# Patient Record
Sex: Female | Born: 1960
Health system: Southern US, Community
[De-identification: ages and names within clinical notes are randomized; demographics above are authoritative.]

## PROBLEM LIST (undated history)

## (undated) DIAGNOSIS — I10 Essential (primary) hypertension: Secondary | ICD-10-CM

---

## 1999-12-18 ENCOUNTER — Other Ambulatory Visit: Admission: RE | Admit: 1999-12-18 | Discharge: 1999-12-18 | Payer: Self-pay | Admitting: Obstetrics and Gynecology

## 2000-06-01 ENCOUNTER — Encounter: Payer: Self-pay | Admitting: Obstetrics & Gynecology

## 2000-06-01 ENCOUNTER — Encounter: Admission: RE | Admit: 2000-06-01 | Discharge: 2000-06-01 | Payer: Self-pay | Admitting: Obstetrics & Gynecology

## 2001-01-26 ENCOUNTER — Other Ambulatory Visit: Admission: RE | Admit: 2001-01-26 | Discharge: 2001-01-26 | Payer: Self-pay | Admitting: Obstetrics and Gynecology

## 2009-06-05 ENCOUNTER — Emergency Department (HOSPITAL_COMMUNITY): Admission: EM | Admit: 2009-06-05 | Discharge: 2009-06-05 | Payer: Self-pay | Admitting: Emergency Medicine

## 2010-03-05 ENCOUNTER — Encounter: Admission: RE | Admit: 2010-03-05 | Discharge: 2010-03-05 | Payer: Self-pay | Admitting: Internal Medicine

## 2011-02-09 LAB — CBC
HCT: 39.2 % (ref 36.0–46.0)
Hemoglobin: 13.1 g/dL (ref 12.0–15.0)
MCHC: 33.5 g/dL (ref 30.0–36.0)
MCV: 88.1 fL (ref 78.0–100.0)
Platelets: 291 10*3/uL (ref 150–400)
RBC: 4.45 MIL/uL (ref 3.87–5.11)
RDW: 13.8 % (ref 11.5–15.5)
WBC: 6.2 10*3/uL (ref 4.0–10.5)

## 2011-02-09 LAB — COMPREHENSIVE METABOLIC PANEL
ALT: 32 U/L (ref 0–35)
AST: 30 U/L (ref 0–37)
Albumin: 4.1 g/dL (ref 3.5–5.2)
Alkaline Phosphatase: 52 U/L (ref 39–117)
Chloride: 100 mEq/L (ref 96–112)
GFR calc Af Amer: >60 mL/min (ref >60)
Potassium: 2.9 mEq/L — ABNORMAL LOW (ref 3.5–5.1)
Sodium: 136 mEq/L (ref 135–145)
Total Bilirubin: 0.6 mg/dL (ref 0.3–1.2)
Total Protein: 7.9 g/dL (ref 6.0–8.3)

## 2011-02-09 LAB — URINALYSIS, ROUTINE W REFLEX MICROSCOPIC
Glucose, UA: NEGATIVE mg/dL
Ketones, ur: NEGATIVE mg/dL
Leukocytes, UA: NEGATIVE
Protein, ur: NEGATIVE mg/dL
pH: 5.5 (ref 5.0–8.0)

## 2011-02-09 LAB — URINE MICROSCOPIC-ADD ON

## 2011-02-09 LAB — POCT CARDIAC MARKERS
Myoglobin, poc: 35.4 ng/mL (ref 12–200)
Myoglobin, poc: 42 ng/mL (ref 12–200)
Troponin i, poc: <0.05 ng/mL (ref 0.00–0.09)

## 2011-02-09 LAB — DIFFERENTIAL
Basophils Absolute: 0 10*3/uL (ref 0.0–0.1)
Basophils Relative: 1 % (ref 0–1)
Eosinophils Relative: 3 % (ref 0–5)
Monocytes Absolute: 0.5 10*3/uL (ref 0.1–1.0)
Monocytes Relative: 8 % (ref 3–12)
Neutro Abs: 3.3 10*3/uL (ref 1.7–7.7)

## 2011-04-08 ENCOUNTER — Other Ambulatory Visit: Payer: Self-pay | Admitting: Internal Medicine

## 2011-04-08 DIAGNOSIS — Z1231 Encounter for screening mammogram for malignant neoplasm of breast: Secondary | ICD-10-CM

## 2011-04-16 ENCOUNTER — Ambulatory Visit
Admission: RE | Admit: 2011-04-16 | Discharge: 2011-04-16 | Disposition: A | Discharge status: Home / Self Care | Payer: BC Managed Care – PPO | Source: Ambulatory Visit | Attending: Internal Medicine | Admitting: Internal Medicine

## 2011-04-16 DIAGNOSIS — Z1231 Encounter for screening mammogram for malignant neoplasm of breast: Secondary | ICD-10-CM

## 2015-06-07 ENCOUNTER — Emergency Department (HOSPITAL_COMMUNITY): Payer: BC Managed Care – PPO

## 2015-06-07 ENCOUNTER — Encounter (HOSPITAL_COMMUNITY): Payer: Self-pay | Admitting: Emergency Medicine

## 2015-06-07 ENCOUNTER — Emergency Department (HOSPITAL_COMMUNITY)
Admission: EM | Admit: 2015-06-07 | Discharge: 2015-06-07 | Disposition: A | Discharge status: Home / Self Care | Payer: BC Managed Care – PPO | Attending: Emergency Medicine | Admitting: Emergency Medicine

## 2015-06-07 DIAGNOSIS — S20312A Abrasion of left front wall of thorax, initial encounter: Secondary | ICD-10-CM | POA: Diagnosis not present

## 2015-06-07 DIAGNOSIS — Y998 Other external cause status: Secondary | ICD-10-CM | POA: Insufficient documentation

## 2015-06-07 DIAGNOSIS — Y92410 Unspecified street and highway as the place of occurrence of the external cause: Secondary | ICD-10-CM | POA: Insufficient documentation

## 2015-06-07 DIAGNOSIS — I10 Essential (primary) hypertension: Secondary | ICD-10-CM | POA: Insufficient documentation

## 2015-06-07 DIAGNOSIS — Y9389 Activity, other specified: Secondary | ICD-10-CM | POA: Insufficient documentation

## 2015-06-07 DIAGNOSIS — S50312A Abrasion of left elbow, initial encounter: Secondary | ICD-10-CM | POA: Diagnosis not present

## 2015-06-07 DIAGNOSIS — S6992XA Unspecified injury of left wrist, hand and finger(s), initial encounter: Secondary | ICD-10-CM | POA: Insufficient documentation

## 2015-06-07 DIAGNOSIS — Z720 Tobacco use: Secondary | ICD-10-CM | POA: Diagnosis not present

## 2015-06-07 DIAGNOSIS — M79642 Pain in left hand: Secondary | ICD-10-CM

## 2015-06-07 DIAGNOSIS — S81011A Laceration without foreign body, right knee, initial encounter: Secondary | ICD-10-CM | POA: Insufficient documentation

## 2015-06-07 HISTORY — DX: Essential (primary) hypertension: I10

## 2015-06-07 LAB — I-STAT CREATININE, ED: Creatinine, Ser: 1.1 mg/dL — ABNORMAL HIGH (ref 0.44–1.00)

## 2015-06-07 MED ORDER — METHOCARBAMOL 500 MG PO TABS: 500.0000 mg | ORAL_TABLET | Freq: Two times a day (BID) | ORAL | Status: DC

## 2015-06-07 MED ORDER — NAPROXEN 250 MG PO TABS
500.0000 mg | ORAL_TABLET | Freq: Once | ORAL | Status: AC
  Administered 2015-06-07: 500 mg via ORAL
  Filled 2015-06-07: qty 2

## 2015-06-07 MED ORDER — MORPHINE SULFATE 4 MG/ML IJ SOLN
4.0000 mg | Freq: Once | INTRAMUSCULAR | Status: AC
  Administered 2015-06-07: 4 mg via INTRAVENOUS
  Filled 2015-06-07: qty 1

## 2015-06-07 MED ORDER — IOHEXOL 300 MG/ML  SOLN
75.0000 mL | Freq: Once | INTRAMUSCULAR | Status: AC | PRN
  Administered 2015-06-07: 75 mL via INTRAVENOUS

## 2015-06-07 MED ORDER — HYDROCODONE-ACETAMINOPHEN 5-325 MG PO TABS: 1.0000 | ORAL_TABLET | Freq: Four times a day (QID) | ORAL | Status: DC | PRN

## 2015-06-07 MED ORDER — HYDROCODONE-ACETAMINOPHEN 5-325 MG PO TABS
1.0000 | ORAL_TABLET | Freq: Once | ORAL | Status: AC
  Administered 2015-06-07: 1 via ORAL
  Filled 2015-06-07: qty 1

## 2015-06-07 MED ORDER — TETANUS-DIPHTH-ACELL PERTUSSIS 5-2.5-18.5 LF-MCG/0.5 IM SUSP
0.5000 mL | Freq: Once | INTRAMUSCULAR | Status: AC
Start: 2015-06-07 — End: 2015-06-07
  Administered 2015-06-07: 0.5 mL via INTRAMUSCULAR
  Filled 2015-06-07: qty 0.5

## 2015-06-07 MED ORDER — ONDANSETRON HCL 4 MG/2ML IJ SOLN
4.0000 mg | Freq: Once | INTRAMUSCULAR | Status: AC
  Administered 2015-06-07: 4 mg via INTRAVENOUS
  Filled 2015-06-07: qty 2

## 2015-06-07 MED ORDER — NAPROXEN 500 MG PO TABS: 500.0000 mg | ORAL_TABLET | Freq: Two times a day (BID) | ORAL | Status: DC
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
## 2015-06-07 NOTE — ED Notes (Signed)
ED PA informed of pt BP. Pt c/o increased pain after moving around to left wrist. Pt has hx of HTN but not on BP medications anymore because of diet and exercise. Pt informed of need to recheck BP at PCP office. Prescribed pain medications being ordered for pt to take a dose here.

## 2015-06-07 NOTE — ED Notes (Signed)
Patient transported to CT 
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
## 2015-11-09 ENCOUNTER — Ambulatory Visit: Payer: BC Managed Care – PPO | Attending: Family Medicine

## 2015-12-13 ENCOUNTER — Ambulatory Visit (INDEPENDENT_AMBULATORY_CARE_PROVIDER_SITE_OTHER): Payer: BC Managed Care – PPO | Admitting: Family Medicine

## 2015-12-13 ENCOUNTER — Encounter: Payer: Self-pay | Admitting: Family Medicine

## 2015-12-13 VITALS — BP 150/84 | HR 89 | Temp 98.3°F | Ht 66.0 in

## 2015-12-13 DIAGNOSIS — Z1239 Encounter for other screening for malignant neoplasm of breast: Secondary | ICD-10-CM | POA: Diagnosis not present

## 2015-12-13 DIAGNOSIS — Z23 Encounter for immunization: Secondary | ICD-10-CM

## 2015-12-13 DIAGNOSIS — Z1211 Encounter for screening for malignant neoplasm of colon: Secondary | ICD-10-CM

## 2015-12-13 DIAGNOSIS — I10 Essential (primary) hypertension: Secondary | ICD-10-CM

## 2015-12-13 DIAGNOSIS — Z7689 Persons encountering health services in other specified circumstances: Secondary | ICD-10-CM

## 2015-12-13 DIAGNOSIS — Z7189 Other specified counseling: Secondary | ICD-10-CM

## 2015-12-13 LAB — COMPLETE METABOLIC PANEL WITH GFR
ALBUMIN: 4.4 g/dL (ref 3.6–5.1)
ALK PHOS: 57 U/L (ref 33–130)
ALT: 56 U/L — AB (ref 6–29)
AST: 31 U/L (ref 10–35)
BUN: 11 mg/dL (ref 7–25)
CALCIUM: 9.5 mg/dL (ref 8.6–10.4)
CHLORIDE: 106 mmol/L (ref 98–110)
CO2: 25 mmol/L (ref 20–31)
CREATININE: 0.93 mg/dL (ref 0.50–1.05)
GFR, EST AFRICAN AMERICAN: 82 mL/min (ref >=60)
GFR, Est Non African American: 71 mL/min (ref >=60)
Glucose, Bld: 77 mg/dL (ref 65–99)
POTASSIUM: 4.5 mmol/L (ref 3.5–5.3)
Sodium: 141 mmol/L (ref 135–146)
Total Bilirubin: 0.7 mg/dL (ref 0.2–1.2)
Total Protein: 7.6 g/dL (ref 6.1–8.1)

## 2015-12-13 LAB — CBC WITH DIFFERENTIAL/PLATELET
BASOS ABS: 0.1 10*3/uL (ref 0.0–0.1)
BASOS PCT: 1 % (ref 0–1)
EOS PCT: 5 % (ref 0–5)
Eosinophils Absolute: 0.3 10*3/uL (ref 0.0–0.7)
HEMATOCRIT: 41.6 % (ref 36.0–46.0)
Hemoglobin: 13.5 g/dL (ref 12.0–15.0)
LYMPHS PCT: 41 % (ref 12–46)
Lymphs Abs: 2.5 10*3/uL (ref 0.7–4.0)
MCH: 28.5 pg (ref 26.0–34.0)
MCHC: 32.5 g/dL (ref 30.0–36.0)
MCV: 87.8 fL (ref 78.0–100.0)
MONO ABS: 0.5 10*3/uL (ref 0.1–1.0)
MPV: 9.4 fL (ref 8.6–12.4)
Monocytes Relative: 8 % (ref 3–12)
Neutro Abs: 2.7 10*3/uL (ref 1.7–7.7)
Neutrophils Relative %: 45 % (ref 43–77)
PLATELETS: 214 10*3/uL (ref 150–400)
RBC: 4.74 MIL/uL (ref 3.87–5.11)
RDW: 14.7 % (ref 11.5–15.5)
WBC: 6.1 10*3/uL (ref 4.0–10.5)

## 2015-12-13 LAB — TSH: TSH: 1.87 mIU/L

## 2015-12-13 LAB — LIPID PANEL
CHOLESTEROL: 153 mg/dL (ref 125–200)
HDL: 87 mg/dL (ref >=46)
LDL CALC: 54 mg/dL (ref <130)
TRIGLYCERIDES: 59 mg/dL (ref <150)
Total CHOL/HDL Ratio: 1.8 Ratio (ref <=5.0)
VLDL: 12 mg/dL (ref <30)

## 2015-12-13 MED ORDER — AMLODIPINE BESYLATE 10 MG PO TABS: 10.0000 mg | ORAL_TABLET | Freq: Every day | ORAL | Status: DC

## 2015-12-13 NOTE — Patient Instructions (Signed)
Recheck BP with nurse visit in one month.  Watch salt in diet. Call [phone] to set up mammogram Avoid lots of fats, cholesterol, carbs. Eat more fresh fruits and vegetables and lean protein that is not fried Exercise regularly.

## 2015-12-13 NOTE — Progress Notes (Signed)
Patient ID: Meagan Barrett, female   DOB: 1961-02-14, 55 y.o.   MRN: [id number]   Meagan Barrett, is a 55 y.o. female  [id number]  [id number]  DOB - August 25, 1961  CC:  Chief Complaint  Patient presents with  . Establish Care  . Dental Pain    needs a referral  . Hypertension    has noticed blood preasure is elevated  . Depression       HPI: Meagan Barrett is a 55 y.o. female here to establish care. She has a history of hypertension. She was on medication in the past but not in last few years. She recently was a dentist and was found to have elevated BP. She reports dental pain and request a dental referral with her orange card. She admits to a low level of depression, which she connect to a divorce 7 years ago. She denies needing help with counseling or medication. She reports to having Sickle Cell Trait. She is on no chronic medications.   Health Maintenance:  She declines a flu shot but agrees to a Tdap. She is in need of a mammogram. She also needs colon cancer screening.  She reports having a normal PAP in 2016 and will get me those results.   No Known Allergies Past Medical History  Diagnosis Date  . Hypertension    Current Outpatient Prescriptions on File Prior to Visit  Medication Sig Dispense Refill  . diphenhydramine-acetaminophen (TYLENOL PM) 25-500 MG TABS Take 1 tablet by mouth at bedtime as needed (headache). Reported on 12/13/2015     No current facility-administered medications on file prior to visit.   Family History  Problem Relation Age of Onset  . Family history unknown: Yes   Social History   Social History  . Marital Status: Single    Spouse Name: N/A  . Number of Children: N/A  . Years of Education: N/A   Occupational History  . Not on file.   Social History Main Topics  . Smoking status: Current Some Day Smoker  . Smokeless tobacco: Not on file  . Alcohol Use: Yes  . Drug Use: Yes    Special: Marijuana  . Sexual Activity: Not on file    Other Topics Concern  . Not on file   Social History Narrative    Review of Systems: Constitutional: Negative for fever, chills, appetite change. She reports fatigue and loss of appetite, related to "being blue" Skin: Negative for rashes or lesions of concern. HENT: Negative for ear pain, ear discharge.nose bleeds Eyes: Negative for pain, discharge, redness, itching and visual disturbance. Neck: Negative for pain, stiffness Respiratory: Negative for cough, shortness of breath,   Cardiovascular: Negative for chest pain, palpitations and leg swelling. Gastrointestinal: Negative for abdominal pain, nausea, vomiting, diarrhea, constipations Genitourinary: Negative for dysuria, urgency, frequency, hematuria,  Musculoskeletal: Negative for back pain, joint pain, joint  swelling, and gait problem.Negative for weakness. Neurological: Negative for  tremors, seizures, syncope,   light-headedness, numbness and headaches. Positive for occassional dizziness (lightheadedness).  Hematological: Negative for easy bruising or bleeding Psychiatric/Behavioral: Positive for depression, anxiety. Denies need for intervention.   Objective:   Filed Vitals:   12/13/15 0834  BP: 150/84  Pulse: 89  Temp: 98.3 F (36.8 C)    Physical Exam: Constitutional: Patient appears well-developed and well-nourished. No distress. HENT: Normocephalic, atraumatic, External right and left ear normal. Oropharynx is clear and moist.  Eyes: Conjunctivae and EOM are normal. PERRLA, no scleral icterus. Neck: Normal ROM. Neck supple. No  lymphadenopathy, No thyromegaly. CVS: RRR, S1/S2 +, no murmurs, no gallops, no rubs Pulmonary: Effort and breath sounds normal, no stridor, rhonchi, wheezes, rales.  Abdominal: Soft. Normoactive BS,, no distension, tenderness, rebound or guarding.  Musculoskeletal: Normal range of motion. No edema and no tenderness.  Neuro: Alert.Normal muscle tone coordination. Non-focal Skin: Skin is  warm and dry. No rash noted. Not diaphoretic. No erythema. No pallor. Psychiatric: Normal mood and affect. Behavior, judgment, thought content normal.  Lab Results  Component Value Date   WBC 6.2 06/05/2009   HGB 13.1 06/05/2009   HCT 39.2 06/05/2009   MCV 88.1 06/05/2009   PLT 291 06/05/2009   Lab Results  Component Value Date   CREATININE 1.10* 06/07/2015   BUN 7 06/05/2009   NA 136 06/05/2009   K 2.9* 06/05/2009   CL 100 06/05/2009   CO2 27 06/05/2009    No results found for: HGBA1C Lipid Panel  No results found for: CHOL, TRIG, HDL, CHOLHDL, VLDL, LDLCALC     Assessment and plan:   1. Essential hypertension  - COMPLETE METABOLIC PANEL WITH GFR - CBC w/Diff - Lipid panel - TSH  2. Colon cancer screening  - [place] Stool Card-TAKE HOME  3. Need for Tdap vaccination - Tdap vaccine greater than or equal to 7yo IM  4. Breast cancer screening  - MM DIGITAL SCREENING BILATERAL; Future  5. Encounter to establish care - I have reviewed information provided by the patient and pertinent information from the chart. -I have advise her on health life style practices as to diet and exercise.   Return in about 3 months (around 03/11/2016) for HTN.  The patient was given clear instructions to go to ER or return to medical center if symptoms don't improve, worsen or new problems develop. The patient verbalized understanding.    Meagan Hoover FNP  12/13/2015, 10:42 AM

## 2015-12-14 ENCOUNTER — Telehealth: Payer: Self-pay

## 2015-12-14 MED FILL — ?AMLODIPINE BESYLATE 10 MG: 10 | 30 days supply | Qty: 30 | Fill #0

## 2015-12-14 NOTE — Telephone Encounter (Signed)
Patient return call and i advised of labs and to avoid alcohol and tylenol. Thanks!

## 2015-12-14 NOTE — Telephone Encounter (Signed)
Called, no answer, left message for patient to call back regarding labs. Thanks!

## 2015-12-14 NOTE — Telephone Encounter (Signed)
-----   Message from Meagan Hoover, NP sent at 12/14/2015 10:08 AM EST ----- Cmet shows slight decrease in liver functions. Avoid alcohol and tylenol.Cholesterol is OK. TSH and CBC wnl.

## 2015-12-25 ENCOUNTER — Telehealth: Payer: Self-pay | Admitting: *Deleted

## 2015-12-25 NOTE — Telephone Encounter (Signed)
Linda please see message from patient below. Thanks!

## 2015-12-25 NOTE — Telephone Encounter (Signed)
Pt called and states that Linda wantBonita Quin to know which provider did her last PAP smear.  Her PAP smear was done at the [facility] done by Fonnie Mu on 5/16. [phone]) Please advise provider) . Thanks

## 2015-12-27 ENCOUNTER — Ambulatory Visit
Admission: RE | Admit: 2015-12-27 | Discharge: 2015-12-27 | Disposition: A | Discharge status: Home / Self Care | Payer: No Typology Code available for payment source | Source: Ambulatory Visit | Attending: Family Medicine | Admitting: Family Medicine

## 2015-12-27 DIAGNOSIS — Z1239 Encounter for other screening for malignant neoplasm of breast: Secondary | ICD-10-CM

## 2015-12-27 NOTE — Telephone Encounter (Signed)
Results are not in chart. Please see if we can get.

## 2015-12-31 NOTE — Telephone Encounter (Signed)
Spoke with Meagan Barrett at the [facility], the pap smears they perform are not put in EPIC. They only have hard copies and she could not locate this patients. I contacted patient and she states she received a letter from [facility] stating that it was WNL.

## 2016-01-10 ENCOUNTER — Other Ambulatory Visit: Payer: No Typology Code available for payment source

## 2016-01-14 ENCOUNTER — Other Ambulatory Visit: Payer: Self-pay | Admitting: Family Medicine

## 2016-01-14 ENCOUNTER — Telehealth: Payer: Self-pay | Admitting: *Deleted

## 2016-01-14 DIAGNOSIS — K029 Dental caries, unspecified: Secondary | ICD-10-CM

## 2016-01-14 NOTE — Telephone Encounter (Signed)
Pt called and stated that she needs a referral for a dentist. Pt was advised to call back in a week if she hasn't heard anything by then

## 2016-01-14 NOTE — Telephone Encounter (Signed)
Bonita QuinLinda, Can you put in referral for dentist if advised? Please advise.  Thanks!

## 2016-01-17 MED FILL — AMLODIPINE BESYLATE 10 MG T: 10 | 30 days supply | Qty: 30 | Fill #1

## 2016-01-18 MED FILL — AMOXICILLIN 500 MG CAPSULE: 500 | 7 days supply | Qty: 21 | Fill #0

## 2016-02-27 MED FILL — AMLODIPINE BESYLATE 10 MG T: 10 | 30 days supply | Qty: 30 | Fill #2

## 2016-03-13 ENCOUNTER — Ambulatory Visit: Payer: BC Managed Care – PPO | Admitting: Family Medicine

## 2016-03-14 ENCOUNTER — Encounter: Payer: Self-pay | Admitting: Family Medicine

## 2016-03-14 ENCOUNTER — Ambulatory Visit (INDEPENDENT_AMBULATORY_CARE_PROVIDER_SITE_OTHER): Payer: No Typology Code available for payment source | Admitting: Family Medicine

## 2016-03-14 VITALS — BP 118/69 | HR 71 | Temp 98.4°F | Resp 18 | Ht 65.0 in | Wt 156.0 lb

## 2016-03-14 DIAGNOSIS — N951 Menopausal and female climacteric states: Secondary | ICD-10-CM

## 2016-03-14 DIAGNOSIS — I10 Essential (primary) hypertension: Secondary | ICD-10-CM | POA: Insufficient documentation

## 2016-03-14 DIAGNOSIS — Z1211 Encounter for screening for malignant neoplasm of colon: Secondary | ICD-10-CM

## 2016-03-14 MED ORDER — AMLODIPINE BESYLATE 10 MG PO TABS: 10.0000 mg | ORAL_TABLET | Freq: Every day | ORAL | Status: DC

## 2016-03-14 MED ORDER — CONJ ESTROG-MEDROXYPROGEST ACE 0.3-1.5 MG PO TABS: 1.0000 | ORAL_TABLET | Freq: Every day | ORAL | Status: DC

## 2016-03-14 NOTE — Progress Notes (Signed)
Patient ID: Meagan Barrett, female   DOB: 06-17-1961, 55 y.o.   MRN: [id number]   Meagan Barrett, is a 55 y.o. female  [id number]  [id number]  DOB - June 28, 1961  CC:  Chief Complaint  Patient presents with  . Follow-up    3 month       HPI: Meagan Barrett is a 55 y.o. female here for follow-up of hypertension. She is on Amlodipine 10 mg daily. Her only complaints today are of fatigue and severe hot flashes causing insomnia. She wishes to try HRT. She has not tried in past.  Health Maintenance:  Needs HIV and Hep C. Will do on next visit, when we draw labs again. She has had a PAP within the last 2 years. Her mammogram is UTD. She is in need of colon cancer screening.    No Known Allergies Past Medical History  Diagnosis Date  . Hypertension    Current Outpatient Prescriptions on File Prior to Visit  Medication Sig Dispense Refill  . diphenhydramine-acetaminophen (TYLENOL PM) 25-500 MG TABS Take 1 tablet by mouth at bedtime as needed (headache). Reported on 03/14/2016    . ibuprofen (ADVIL,MOTRIN) 200 MG tablet Take 200 mg by mouth every 6 (six) hours as needed. Reported on 03/14/2016     No current facility-administered medications on file prior to visit.   Family History  Problem Relation Age of Onset  . Family history unknown: Yes   Social History   Social History  . Marital Status: Single    Spouse Name: N/A  . Number of Children: N/A  . Years of Education: N/A   Occupational History  . Not on file.   Social History Main Topics  . Smoking status: Current Some Day Smoker -- 0.25 packs/day    Types: Cigarettes  . Smokeless tobacco: Not on file  . Alcohol Use: Yes  . Drug Use: Yes    Special: Marijuana  . Sexual Activity: Not on file   Other Topics Concern  . Not on file   Social History Narrative    Review of Systems: Constitutional: Negative for fever, chills, appetite change, weight loss. Positive for fatigue, hot flashes and insomnia related to  menopause. Skin: Negative for rashes or lesions of concern. HENT: Negative for ear pain, ear discharge.nose bleeds Eyes: Negative for pain, discharge, redness, itching and visual disturbance. Neck: Negative for pain, stiffness Respiratory: Negative for cough, shortness of breath,   Cardiovascular: Negative for chest pain, palpitations and leg swelling. Gastrointestinal: Negative for abdominal pain, nausea, vomiting, diarrhea, constipations Genitourinary: Negative for dysuria, urgency, frequency, hematuria,  Musculoskeletal: Negative for back pain, joint pain, joint  swelling, and gait problem.Negative for weakness. Neurological: Negative for dizziness, tremors, seizures, syncope,   light-headedness, numbness and headaches.  Hematological: Negative for easy bruising or bleeding Psychiatric/Behavioral: Negative for depression, anxiety, decreased concentration, confusion   Objective:   Filed Vitals:   03/14/16 0836  BP: 118/69  Pulse: 71  Temp: 98.4 F (36.9 C)  Resp: 18    Physical Exam: Constitutional: Patient appears well-developed and well-nourished. No distress. HENT: Normocephalic, atraumatic, External right and left ear normal. Oropharynx is clear and moist.  Eyes: Conjunctivae and EOM are normal. PERRLA, no scleral icterus. Neck: Normal ROM. Neck supple. No lymphadenopathy, No thyromegaly. CVS: RRR, S1/S2 +, no murmurs, no gallops, no rubs Pulmonary: Effort and breath sounds normal, no stridor, rhonchi, wheezes, rales.  Abdominal: Soft. Normoactive BS,, no distension, tenderness, rebound or guarding.  Musculoskeletal: Normal range of motion. No edema  and no tenderness.  Neuro: Alert.Normal muscle tone coordination. Non-focal Skin: Skin is warm and dry. No rash noted. Not diaphoretic. No erythema. No pallor. Psychiatric: Normal mood and affect. Behavior, judgment, thought content normal.  Lab Results  Component Value Date   WBC 6.1 12/13/2015   HGB 13.5 12/13/2015   HCT  41.6 12/13/2015   MCV 87.8 12/13/2015   PLT 214 12/13/2015   Lab Results  Component Value Date   CREATININE 0.93 12/13/2015   BUN 11 12/13/2015   NA 141 12/13/2015   K 4.5 12/13/2015   CL 106 12/13/2015   CO2 25 12/13/2015    No results found for: HGBA1C Lipid Panel     Component Value Date/Time   CHOL 153 12/13/2015 0926   TRIG 59 12/13/2015 0926   HDL 87 12/13/2015 0926   CHOLHDL 1.8 12/13/2015 0926   VLDL 12 12/13/2015 0926   LDLCALC 54 12/13/2015 0926       Assessment and plan:   1. Colon cancer screening  - POC Hemoccult Bld/Stl (3-Cd Home Screen); Future   2. Menopausal Hot flashes -Prempro .3 #90, one po q day. 1 RF.   6 months.  The patient was given clear instructions to go to ER or return to medical center if symptoms don't improve, worsen or new problems develop. The patient verbalized understanding.    Henrietta HooverLinda C Bernhardt FNP  03/14/2016, 9:34 AM

## 2016-03-14 NOTE — Patient Instructions (Signed)
I will send medication for menopausal symptoms to your pharmacy Come back in 3 months for BP check and labs.Remind me that we want to do HIV and Hep C when you come back. Pay attention to what you may have eaten in previous 6 hours next time lip swellig occurs

## 2016-03-14 NOTE — Progress Notes (Signed)
Patient is here for 3 Month FU  Patient denies pain at this time.  Patient has not taken any medication and patient has only had coffee.

## 2016-03-18 MED FILL — ESTRADIOL-NORETH 0.5-0.1 MG: 0.5-0.1 | 28 days supply | Qty: 28 | Fill #0

## 2016-04-01 MED FILL — ?AMLODIPINE BESYLATE 10 MG: 10 | 30 days supply | Qty: 30 | Fill #3

## 2016-04-02 ENCOUNTER — Telehealth: Payer: Self-pay

## 2016-04-02 NOTE — Telephone Encounter (Signed)
Linda please advise. Thanks!  

## 2016-04-02 NOTE — Telephone Encounter (Signed)
Pt called and stated that her hot flashes are not any better. She is requesting a different prescription. Thanks!

## 2016-04-04 NOTE — Telephone Encounter (Signed)
While these medications have different brand names, they are all the same medication. There is no other prescription.

## 2016-04-14 MED FILL — ESTRADIOL-NORETH 0.5-0.1 MG: 0.5-0.1 | 28 days supply | Qty: 28 | Fill #1

## 2016-05-07 MED FILL — ?AMLODIPINE BESYLATE 10 MG: 10 | 30 days supply | Qty: 30 | Fill #4

## 2016-05-15 MED FILL — ESTRADIOL-NORETH 0.5-0.1 MG: 0.5-0.1 | 28 days supply | Qty: 28 | Fill #2

## 2016-06-05 MED FILL — AMLODIPINE BESYLATE 10 MG T: 10 | 30 days supply | Qty: 30 | Fill #5

## 2016-06-09 MED FILL — ESTRADIOL-NORETH 0.5-0.1 MG: 0.5-0.1 | 28 days supply | Qty: 28 | Fill #3

## 2016-06-20 ENCOUNTER — Ambulatory Visit: Payer: No Typology Code available for payment source | Admitting: Family Medicine

## 2016-07-14 MED FILL — ESTRADIOL-NORETH 0.5-0.1 MG: 0.5-0.1 | 28 days supply | Qty: 28 | Fill #4

## 2016-07-15 MED FILL — AMLODIPINE BESYLATE 10 MG T: 10 | 30 days supply | Qty: 30 | Fill #0

## 2016-07-23 ENCOUNTER — Ambulatory Visit: Payer: No Typology Code available for payment source | Admitting: Family Medicine

## 2016-08-08 MED FILL — AMLODIPINE BESYLATE 10 MG T: 10 | 30 days supply | Qty: 30 | Fill #1

## 2016-08-08 MED FILL — ESTRADIOL-NORETH 0.5-0.1 MG: 0.5-0.1 | 28 days supply | Qty: 28 | Fill #5

## 2016-09-08 ENCOUNTER — Other Ambulatory Visit: Payer: Self-pay | Admitting: Family Medicine

## 2016-09-08 MED FILL — ESTRADIOL-NORETH 0.5-0.1 MG: 0.5-0.1 | 84 days supply | Qty: 84 | Fill #0

## 2016-09-08 MED FILL — AMLODIPINE BESYLATE 10 MG T: 10 | 30 days supply | Qty: 30 | Fill #2

## 2016-09-08 NOTE — Telephone Encounter (Signed)
May refill 

## 2016-09-08 NOTE — Telephone Encounter (Signed)
Bonita QuinLinda Please advise if this can be filled. Thanks!

## 2016-09-09 NOTE — Telephone Encounter (Signed)
May refill 

## 2016-10-09 MED FILL — AMLODIPINE BESYLATE 10 MG T: 10 | 30 days supply | Qty: 30 | Fill #3

## 2016-11-11 MED FILL — AMLODIPINE BESYLATE 10 MG T: 10 | 30 days supply | Qty: 30 | Fill #4

## 2016-12-01 MED FILL — ESTRADIOL-NORETH 0.5-0.1 MG: 0.5-0.1 | 28 days supply | Qty: 28 | Fill #1

## 2016-12-10 MED FILL — AMLODIPINE BESYLATE 10 MG T: 10 | 30 days supply | Qty: 30 | Fill #5

## 2016-12-31 MED FILL — ESTRADIOL-NORETH 0.5-0.1 MG: 0.5-0.1 | 28 days supply | Qty: 28 | Fill #2

## 2017-01-12 ENCOUNTER — Other Ambulatory Visit: Payer: Self-pay | Admitting: Family Medicine

## 2017-01-14 ENCOUNTER — Other Ambulatory Visit: Payer: Self-pay | Admitting: Family Medicine

## 2017-01-19 ENCOUNTER — Other Ambulatory Visit: Payer: Self-pay | Admitting: Family Medicine

## 2017-01-19 MED FILL — AMLODIPINE BESYLATE 10 MG T: 10 | 30 days supply | Qty: 30 | Fill #0

## 2017-01-22 ENCOUNTER — Ambulatory Visit (INDEPENDENT_AMBULATORY_CARE_PROVIDER_SITE_OTHER): Payer: Self-pay | Admitting: Family Medicine

## 2017-01-22 ENCOUNTER — Encounter: Payer: Self-pay | Admitting: Family Medicine

## 2017-01-22 VITALS — BP 132/78 | HR 65 | Temp 98.7°F | Ht 65.0 in | Wt 164.0 lb

## 2017-01-22 DIAGNOSIS — N951 Menopausal and female climacteric states: Secondary | ICD-10-CM

## 2017-01-22 DIAGNOSIS — I10 Essential (primary) hypertension: Secondary | ICD-10-CM

## 2017-01-22 MED ORDER — ESTRADIOL-NORETHINDRONE ACET 0.5-0.1 MG PO TABS: 1.0000 | ORAL_TABLET | Freq: Every day | ORAL | 1 refills | Status: DC

## 2017-01-22 MED ORDER — AMLODIPINE BESYLATE 10 MG PO TABS: 10.0000 mg | ORAL_TABLET | Freq: Every day | ORAL | 2 refills | Status: DC

## 2017-01-22 MED FILL — ESTRADIOL-NORETH 0.5-0.1 MG: 0.5-0.1 | 28 days supply | Qty: 28 | Fill #0

## 2017-01-22 NOTE — Patient Instructions (Signed)
Begin taking an  81 mg Aspirin daily for heart health. Return in 6 weeks for a gynecological exam.   Venlafaxine extended-release capsules (hot flash medication) What is this medicine? VENLAFAXINE(VEN la fax een) is used to treat depression, anxiety and panic disorder. This medicine may be used for other purposes; ask your health care provider or pharmacist if you have questions. COMMON BRAND NAME(S): Effexor XR What should I tell my health care provider before I take this medicine? They need to know if you have any of these conditions: -bleeding disorders -glaucoma -heart disease -high blood pressure -high cholesterol -kidney disease -liver disease -low levels of sodium in the blood -mania or bipolar disorder -seizures -suicidal thoughts, plans, or attempt; a previous suicide attempt by you or a family -take medicines that treat or prevent blood clots -thyroid disease -an unusual or allergic reaction to venlafaxine, desvenlafaxine, other medicines, foods, dyes, or preservatives -pregnant or trying to get pregnant -breast-feeding How should I use this medicine? Take this medicine by mouth with a full glass of water. Follow the directions on the prescription label. Do not cut, crush, or chew this medicine. Take it with food. If needed, the capsule may be carefully opened and the entire contents sprinkled on a spoonful of cool applesauce. Swallow the applesauce/pellet mixture right away without chewing and follow with a glass of water to ensure complete swallowing of the pellets. Try to take your medicine at about the same time each day. Do not take your medicine more often than directed. Do not stop taking this medicine suddenly except upon the advice of your doctor. Stopping this medicine too quickly may cause serious side effects or your condition may worsen. A special MedGuide will be given to you by the pharmacist with each prescription and refill. Be sure to read this information  carefully each time. Talk to your pediatrician regarding the use of this medicine in children. Special care may be needed. Overdosage: If you think you have taken too much of this medicine contact a poison control center or emergency room at once. NOTE: This medicine is only for you. Do not share this medicine with others. What if I miss a dose? If you miss a dose, take it as soon as you can. If it is almost time for your next dose, take only that dose. Do not take double or extra doses. What may interact with this medicine? Do not take this medicine with any of the following medications: -certain medicines for fungal infections like fluconazole, itraconazole, ketoconazole, posaconazole, voriconazole -cisapride -desvenlafaxine -dofetilide -dronedarone -duloxetine -levomilnacipran -linezolid -MAOIs like Carbex, Eldepryl, Marplan, Nardil, and Parnate -methylene blue (injected into a vein) -milnacipran -pimozide -thioridazine -ziprasidone This medicine may also interact with the following medications: -amphetamines -aspirin and aspirin-like medicines -certain medicines for depression, anxiety, or psychotic disturbances -certain medicines for migraine headaches like almotriptan, eletriptan, frovatriptan, naratriptan, rizatriptan, sumatriptan, zolmitriptan -certain medicines for sleep -certain medicines that treat or prevent blood clots like dalteparin, enoxaparin, warfarin -cimetidine -clozapine -diuretics -fentanyl -furazolidone -indinavir -isoniazid -lithium -metoprolol -NSAIDS, medicines for pain and inflammation, like ibuprofen or naproxen -other medicines that prolong the QT interval (cause an abnormal heart rhythm) -procarbazine -rasagiline -supplements like St. John's wort, kava kava, valerian -tramadol -tryptophan This list may not describe all possible interactions. Give your health care provider a list of all the medicines, herbs, non-prescription drugs, or dietary  supplements you use. Also tell them if you smoke, drink alcohol, or use illegal drugs. Some items may interact with your  medicine. What should I watch for while using this medicine? Tell your doctor if your symptoms do not get better or if they get worse. Visit your doctor or health care professional for regular checks on your progress. Because it may take several weeks to see the full effects of this medicine, it is important to continue your treatment as prescribed by your doctor. Patients and their families should watch out for new or worsening thoughts of suicide or depression. Also watch out for sudden changes in feelings such as feeling anxious, agitated, panicky, irritable, hostile, aggressive, impulsive, severely restless, overly excited and hyperactive, or not being able to sleep. If this happens, especially at the beginning of treatment or after a change in dose, call your health care professional. This medicine can cause an increase in blood pressure. Check with your doctor for instructions on monitoring your blood pressure while taking this medicine. You may get drowsy or dizzy. Do not drive, use machinery, or do anything that needs mental alertness until you know how this medicine affects you. Do not stand or sit up quickly, especially if you are an older patient. This reduces the risk of dizzy or fainting spells. Alcohol may interfere with the effect of this medicine. Avoid alcoholic drinks. Your mouth may get dry. Chewing sugarless gum, sucking hard candy and drinking plenty of water will help. Contact your doctor if the problem does not go away or is severe. What side effects may I notice from receiving this medicine? Side effects that you should report to your doctor or health care professional as soon as possible: -allergic reactions like skin rash, itching or hives, swelling of the face, lips, or tongue -anxious -breathing problems -confusion -changes in vision -chest  pain -confusion -elevated mood, decreased need for sleep, racing thoughts, impulsive behavior -eye pain -fast, irregular heartbeat -feeling faint or lightheaded, falls -feeling agitated, angry, or irritable -hallucination, loss of contact with reality -high blood pressure -loss of balance or coordination -palpitations -redness, blistering, peeling or loosening of the skin, including inside the mouth -restlessness, pacing, inability to keep still -seizures -stiff muscles -suicidal thoughts or other mood changes -trouble passing urine or change in the amount of urine -trouble sleeping -unusual bleeding or bruising -unusually weak or tired -vomiting Side effects that usually do not require medical attention (report to your doctor or health care professional if they continue or are bothersome): -change in sex drive or performance -change in appetite or weight -constipation -dizziness -dry mouth -headache -increased sweating -nausea -tired This list may not describe all possible side effects. Call your doctor for medical advice about side effects. You may report side effects to FDA at 1-800-FDA-1088. Where should I keep my medicine? Keep out of the reach of children. Store at a controlled temperature between 20 and 25 degrees C (68 degrees and 77 degrees F), in a dry place. Throw away any unused medicine after the expiration date. NOTE: This sheet is a summary. It may not cover all possible information. If you have questions about this medicine, talk to your doctor, pharmacist, or health care provider.  2018 Elsevier/Gold Standard (2016-03-20 18:38:02) Menopause Menopause is the normal time of life when menstrual periods stop completely. Menopause is complete when you have missed 12 consecutive menstrual periods. It usually occurs between the ages of 48 years and 55 years. Very rarely does a woman develop menopause before the age of 40 years. At menopause, your ovaries stop  producing the female hormones estrogen and progesterone. This can cause  undesirable symptoms and also affect your health. Sometimes the symptoms may occur 4-5 years before the menopause begins. There is no relationship between menopause and:  Oral contraceptives.  Number of children you had.  Race.  The age your menstrual periods started (menarche). Heavy smokers and very thin women may develop menopause earlier in life. What are the causes?  The ovaries stop producing the female hormones estrogen and progesterone. Other causes include:  Surgery to remove both ovaries.  The ovaries stop functioning for no known reason.  Tumors of the pituitary gland in the brain.  Medical disease that affects the ovaries and hormone production.  Radiation treatment to the abdomen or pelvis.  Chemotherapy that affects the ovaries. What are the signs or symptoms?  Hot flashes.  Night sweats.  Decrease in sex drive.  Vaginal dryness and thinning of the vagina causing painful intercourse.  Dryness of the skin and developing wrinkles.  Headaches.  Tiredness.  Irritability.  Memory problems.  Weight gain.  Bladder infections.  Hair growth of the face and chest.  Infertility. More serious symptoms include:  Loss of bone (osteoporosis) causing breaks (fractures).  Depression.  Hardening and narrowing of the arteries (atherosclerosis) causing heart attacks and strokes. How is this diagnosed?  When the menstrual periods have stopped for 12 straight months.  Physical exam.  Hormone studies of the blood. How is this treated? There are many treatment choices and nearly as many questions about them. The decisions to treat or not to treat menopausal changes is an individual choice made with your health care provider. Your health care provider can discuss the treatments with you. Together, you can decide which treatment will work best for you. Your treatment choices may  include:  Hormone therapy (estrogen and progesterone).  Non-hormonal medicines.  Treating the individual symptoms with medicine (for example antidepressants for depression).  Herbal medicines that may help specific symptoms.  Counseling by a psychiatrist or psychologist.  Group therapy.  Lifestyle changes including:  Eating healthy.  Regular exercise.  Limiting caffeine and alcohol.  Stress management and meditation.  No treatment. Follow these instructions at home:  Take the medicine your health care provider gives you as directed.  Get plenty of sleep and rest.  Exercise regularly.  Eat a diet that contains calcium (good for the bones) and soy products (acts like estrogen hormone).  Avoid alcoholic beverages.  Do not smoke.  If you have hot flashes, dress in layers.  Take supplements, calcium, and vitamin D to strengthen bones.  You can use over-the-counter lubricants or moisturizers for vaginal dryness.  Group therapy is sometimes very helpful.  Acupuncture may be helpful in some cases. Contact a health care provider if:  You are not sure you are in menopause.  You are having menopausal symptoms and need advice and treatment.  You are still having menstrual periods after age 89 years.  You have pain with intercourse.  Menopause is complete (no menstrual period for 12 months) and you develop vaginal bleeding.  You need a referral to a specialist (gynecologist, psychiatrist, or psychologist) for treatment. Get help right away if:  You have severe depression.  You have excessive vaginal bleeding.  You fell and think you have a broken bone.  You have pain when you urinate.  You develop leg or chest pain.  You have a fast pounding heart beat (palpitations).  You have severe headaches.  You develop vision problems.  You feel a lump in your breast.  You have abdominal  pain or severe indigestion. This information is not intended to replace  advice given to you by your health care provider. Make sure you discuss any questions you have with your health care provider. Document Released: 01/10/2004 Document Revised: 03/27/2016 Document Reviewed: 05/19/2013 Elsevier Interactive Patient Education  2017 [employer].

## 2017-01-22 NOTE — Progress Notes (Signed)
Patient ID: Meagan Barrett, female    DOB: August 28, 1961, 56 y.o.   MRN: [id number]  PCP: Joaquin Courts, FNP  Chief Complaint  Patient presents with  . Follow-up    MEDICATION/BLOOD PRESSRE/PATIENT HAS BEEN OUT OF MEDS FOR A WEEK    Subjective:  HPI  Meagan Barrett is a 56 y.o. female presents to establish care and request medication refills. Chronic problems include: hypertension, post menopausal related hot flashes, anxiety, chronic complaint of chest pain associated with anxiety. Marland Kitchen Hot Flashes  She recently was placed on estradiol for hot flash management which she reports has completely resolved her symptoms. She has been post menopausal for less than 5 years. Episodic heat intolerance became problematic and therefore she requested treatment. Denies hx of DVT, CVA, clotting disorders. She had started smoking recently, reports complete cessation presently.  Hypertension  Blood pressure controlled during office visits with amlodipine 10 mg.  She doesn't routinely monitor blood pressure at home. Denies headaches, shortness of breath, or dizziness. Reports a greater than 5 year hx of chest pain. Had a serial of negative cardiac test and negative stress test in 2009.She was told by a cardiologist at that times chest pain was likely related to anxiety and stress. Pt endorses being a very anxious person. Pain typically last less 30 seconds. Actively exercises several times weekly which includes running. Denies ever experiencing chest pain during exercise or physical activity. Denies any lower extremity swelling.  Social History   Social History  . Marital status: Single    Spouse name: N/A  . Number of children: N/A  . Years of education: N/A   Occupational History  . Not on file.   Social History Main Topics  . Smoking status: Current Some Day Smoker    Packs/day: 0.25    Types: Cigarettes  . Smokeless tobacco: Never Used  . Alcohol use Yes  . Drug use: Yes    Types:  Marijuana  . Sexual activity: Not on file   Other Topics Concern  . Not on file   Social History Narrative  . No narrative on file    Family History  Problem Relation Age of Onset  . Family history unknown: Yes   Review of Systems See HPI Patient Active Problem List   Diagnosis Date Noted  . Essential hypertension 03/14/2016    No Known Allergies  Prior to Admission medications   Medication Sig Start Date End Date Taking? Authorizing Provider  amLODipine (NORVASC) 10 MG tablet TAKE 1 TABLET BY MOUTH DAILY. 01/19/17  Yes Henrietta Hoover, NP  Estradiol-Norethindrone Acet 0.5-0.1 MG tablet TAKE 1 TABLET BY MOUTH ONCE DAILY 09/08/16  Yes Henrietta Hoover, NP  diphenhydramine-acetaminophen (TYLENOL PM) 25-500 MG TABS Take 1 tablet by mouth at bedtime as needed (headache). Reported on 03/14/2016    Historical Provider, MD  estrogen, conjugated,-medroxyprogesterone (PREMPRO) 0.3-1.5 MG tablet Take 1 tablet by mouth daily. 03/14/16   Henrietta Hoover, NP   Past Medical, Surgical Family and Social History reviewed and updated.    Objective:   Today's Vitals   01/22/17 0849  BP: 132/78  Pulse: 65  Temp: 98.7 F (37.1 C)  TempSrc: Oral  SpO2: 100%  Weight: 164 lb (74.4 kg)  Height: 5\' 5"  (1.651 m)    Wt Readings from Last 3 Encounters:  01/22/17 164 lb (74.4 kg)  03/14/16 156 lb (70.8 kg)  06/07/15 150 lb (68 kg)    Physical Exam  Constitutional: She is oriented to  person, place, and time. She appears well-developed and well-nourished.  HENT:  Head: Normocephalic and atraumatic.  Eyes: Conjunctivae and EOM are normal. Pupils are equal, round, and reactive to light.  Neck: Normal range of motion. Neck supple.  Cardiovascular: Normal rate, regular rhythm, normal heart sounds and intact distal pulses.  Exam reveals no gallop and no friction rub.   No murmur heard. Pulmonary/Chest: Effort normal and breath sounds normal.  Neurological: She is alert and oriented to  person, place, and time.  Skin: Skin is warm and dry.  Psychiatric: She has a normal mood and affect. Her behavior is normal. Judgment and thought content normal.      Assessment & Plan:  1. Hot flashes due to menopause -Counseled and provided literature on non-hormonal recommendation for management of hot flashes. -Continue Estradiol-Norethindrone Acet 0.5-0.1 MG, take  1 tablet daily.  -Return in 6 weeks for gynecological exam and we will discuss changing to a non-hormonal medication.  2. Essential hypertension  -Continue Amlodipine 10 mg daily -Start Aspirin 81 mg once daily    Pt is very concerned regarding cost of specialty services. Due to the duration of time since her last stress test, would recommend a referral and  follow-up with cardiology. Counseled patient on worrisome signs cardiac ischemia and indication of when to seek further evaluation of the ED. This chest pain has been persisting for several years and patient is absent of chest pain today.  Return for follow-up in 6 weeks   Godfrey PickKimberly S. Tiburcio PeaHarris, MSN, [facility]NP-C [facility] [address] [place], [place] [address] [phone]
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
## 2017-02-24 MED FILL — ?AMLODIPINE BESYLATE 10 MG: 10 | 30 days supply | Qty: 30 | Fill #1

## 2017-02-24 MED FILL — ESTRADIOL-NORETH 0.5-0.1 MG: 0.5-0.1 | 28 days supply | Qty: 28 | Fill #1

## 2017-03-05 ENCOUNTER — Ambulatory Visit (INDEPENDENT_AMBULATORY_CARE_PROVIDER_SITE_OTHER): Payer: Self-pay | Admitting: Family Medicine

## 2017-03-05 ENCOUNTER — Encounter: Payer: Self-pay | Admitting: Family Medicine

## 2017-03-05 ENCOUNTER — Other Ambulatory Visit (HOSPITAL_COMMUNITY)
Admission: RE | Admit: 2017-03-05 | Discharge: 2017-03-05 | Disposition: A | Discharge status: Home / Self Care | Payer: No Typology Code available for payment source | Source: Ambulatory Visit | Attending: Family Medicine | Admitting: Family Medicine

## 2017-03-05 VITALS — BP 125/72 | HR 77 | Temp 98.1°F | Resp 16 | Ht 65.0 in | Wt 167.6 lb

## 2017-03-05 DIAGNOSIS — Z01419 Encounter for gynecological examination (general) (routine) without abnormal findings: Secondary | ICD-10-CM

## 2017-03-05 DIAGNOSIS — B9689 Other specified bacterial agents as the cause of diseases classified elsewhere: Secondary | ICD-10-CM | POA: Insufficient documentation

## 2017-03-05 DIAGNOSIS — A5901 Trichomonal vulvovaginitis: Secondary | ICD-10-CM | POA: Insufficient documentation

## 2017-03-05 DIAGNOSIS — B373 Candidiasis of vulva and vagina: Secondary | ICD-10-CM | POA: Insufficient documentation

## 2017-03-05 MED ORDER — VENLAFAXINE HCL ER 37.5 MG PO CP24: ORAL_CAPSULE | ORAL | 1 refills | Status: DC

## 2017-03-05 MED FILL — VENLAFAXINE HCL ER 37.5 MG: 37.5 | 30 days supply | Qty: 60 | Fill #0

## 2017-03-05 NOTE — Progress Notes (Signed)
Patient ID: Meagan Barrett, female    DOB: [birth year], 56 y.o.   MRN: [id number]  PCP: Joaquin Courts, FNP  Chief Complaint  Patient presents with  . Follow-up    HORMONE REPLACEMENT/ PAP    Subjective:  HPI Meagan Barrett is a 56 y.o. female presents for gynecological exam and to discuss cessation of  estradiol hormonal replacement therapy.  Medical problems include essential hypertension which is controlled. [place] has reviewed literature regarding Venlafaxine from prior office visit and is in agreement to discontinue estradiol and start medication. She has a positive family history of cancer, mother had breast and facial bone cancer. A little apprehensive as she is uncertain if medication will calm her mood and relieve her hot flashes.  HIV and Hep C vaccines due. She would like to defer these test to next visit. She is sexually active with males and declines STD screening.   Social History   Social History  . Marital status: Single    Spouse name: N/A  . Number of children: N/A  . Years of education: N/A   Occupational History  . Not on file.   Social History Main Topics  . Smoking status: Current Some Day Smoker    Packs/day: 0.25    Types: Cigarettes  . Smokeless tobacco: Never Used  . Alcohol use Yes  . Drug use: Yes    Types: Marijuana  . Sexual activity: Not on file   Other Topics Concern  . Not on file   Social History Narrative  . No narrative on file    Family History  Problem Relation Age of Onset  . Family history unknown: Yes   Review of Systems See HPI Patient Active Problem List   Diagnosis Date Noted  . Essential hypertension 03/14/2016    No Known Allergies  Prior to Admission medications   Medication Sig Start Date End Date Taking? Authorizing Provider  amLODipine (NORVASC) 10 MG tablet Take 1 tablet (10 mg total) by mouth daily. 01/22/17  Yes Bing Neighbors, FNP  Estradiol-Norethindrone Acet 0.5-0.1 MG tablet Take 1 tablet by  mouth daily. 01/22/17  Yes Bing Neighbors, FNP    Past Medical, Surgical Family and Social History reviewed and updated.    Objective:   Today's Vitals   03/05/17 0946  BP: 125/72  Pulse: 77  Resp: 16  Temp: 98.1 F (36.7 C)  TempSrc: Oral  SpO2: 100%  Weight: 167 lb 9.6 oz (76 kg)  Height: 5\' 5"  (1.651 m)    Wt Readings from Last 3 Encounters:  03/05/17 167 lb 9.6 oz (76 kg)  01/22/17 164 lb (74.4 kg)  03/14/16 156 lb (70.8 kg)   Physical Exam  Constitutional: She is oriented to person, place, and time. She appears well-developed and well-nourished.  HENT:  Head: Normocephalic and atraumatic.  Cardiovascular: Normal rate.   Pulmonary/Chest: Effort normal.  Genitourinary: Vagina normal and uterus normal. There is no rash, tenderness, lesion or injury on the right labia. There is no rash, tenderness, lesion or injury on the left labia. No erythema in the vagina. No vaginal discharge found.  Musculoskeletal: Normal range of motion.  Neurological: She is alert and oriented to person, place, and time.  Skin: Skin is warm and dry.  Psychiatric: She has a normal mood and affect. Her behavior is normal. Judgment and thought content normal.    Assessment & Plan:  1. Gynecologic exam normal - Cytology - PAP [place]  2. Hot flashes  -  Venlafaxine XR 75 mg once daily.    RTC: 6 weeks to evaluate effectiveness of therapy for hot flashes.   Godfrey PickKimberly S. Tiburcio PeaHarris, MSN, [facility]NP-C [facility] [address] , [place] [id number] [phone]

## 2017-03-05 NOTE — Patient Instructions (Signed)
Discontinue estradiol replacement.  Start Venlafaxine 37.5 mg once daily for 7 days. Increase to 75 mg (2 tablets) daily which will be your regular dose.  It is okay to start medication tomorrow. Return in 6 weeks or call me sooner if needed to evaluate effectiveness of medication.  -Will update you if any abnormalities occur from the results of your PAP.   Menopause Menopause is the normal time of life when menstrual periods stop completely. Menopause is complete when you have missed 12 consecutive menstrual periods. It usually occurs between the ages of 48 years and 55 years. Very rarely does a woman develop menopause before the age of 40 years. At menopause, your ovaries stop producing the female hormones estrogen and progesterone. This can cause undesirable symptoms and also affect your health. Sometimes the symptoms may occur 4-5 years before the menopause begins. There is no relationship between menopause and:  Oral contraceptives.  Number of children you had.  Race.  The age your menstrual periods started (menarche). Heavy smokers and very thin women may develop menopause earlier in life. What are the causes?  The ovaries stop producing the female hormones estrogen and progesterone. Other causes include:  Surgery to remove both ovaries.  The ovaries stop functioning for no known reason.  Tumors of the pituitary gland in the brain.  Medical disease that affects the ovaries and hormone production.  Radiation treatment to the abdomen or pelvis.  Chemotherapy that affects the ovaries. What are the signs or symptoms?  Hot flashes.  Night sweats.  Decrease in sex drive.  Vaginal dryness and thinning of the vagina causing painful intercourse.  Dryness of the skin and developing wrinkles.  Headaches.  Tiredness.  Irritability.  Memory problems.  Weight gain.  Bladder infections.  Hair growth of the face and chest.  Infertility. More serious symptoms  include:  Loss of bone (osteoporosis) causing breaks (fractures).  Depression.  Hardening and narrowing of the arteries (atherosclerosis) causing heart attacks and strokes. How is this diagnosed?  When the menstrual periods have stopped for 12 straight months.  Physical exam.  Hormone studies of the blood. How is this treated? There are many treatment choices and nearly as many questions about them. The decisions to treat or not to treat menopausal changes is an individual choice made with your health care provider. Your health care provider can discuss the treatments with you. Together, you can decide which treatment will work best for you. Your treatment choices may include:  Hormone therapy (estrogen and progesterone).  Non-hormonal medicines.  Treating the individual symptoms with medicine (for example antidepressants for depression).  Herbal medicines that may help specific symptoms.  Counseling by a psychiatrist or psychologist.  Group therapy.  Lifestyle changes including:  Eating healthy.  Regular exercise.  Limiting caffeine and alcohol.  Stress management and meditation.  No treatment. Follow these instructions at home:  Take the medicine your health care provider gives you as directed.  Get plenty of sleep and rest.  Exercise regularly.  Eat a diet that contains calcium (good for the bones) and soy products (acts like estrogen hormone).  Avoid alcoholic beverages.  Do not smoke.  If you have hot flashes, dress in layers.  Take supplements, calcium, and vitamin D to strengthen bones.  You can use over-the-counter lubricants or moisturizers for vaginal dryness.  Group therapy is sometimes very helpful.  Acupuncture may be helpful in some cases. Contact a health care provider if:  You are not sure you are in  menopause.  You are having menopausal symptoms and need advice and treatment.  You are still having menstrual periods after age 79  years.  You have pain with intercourse.  Menopause is complete (no menstrual period for 12 months) and you develop vaginal bleeding.  You need a referral to a specialist (gynecologist, psychiatrist, or psychologist) for treatment. Get help right away if:  You have severe depression.  You have excessive vaginal bleeding.  You fell and think you have a broken bone.  You have pain when you urinate.  You develop leg or chest pain.  You have a fast pounding heart beat (palpitations).  You have severe headaches.  You develop vision problems.  You feel a lump in your breast.  You have abdominal pain or severe indigestion. This information is not intended to replace advice given to you by your health care provider. Make sure you discuss any questions you have with your health care provider. Document Released: 01/10/2004 Document Revised: 03/27/2016 Document Reviewed: 05/19/2013 Elsevier Interactive Patient Education  2017 [employer].

## 2017-03-06 LAB — CYTOLOGY - PAP
ADEQUACY: ABSENT
Bacterial vaginitis: POSITIVE — AB
Candida vaginitis: POSITIVE — AB
Diagnosis: NEGATIVE
Trichomonas: POSITIVE — AB

## 2017-03-09 MED ORDER — METRONIDAZOLE 500 MG PO TABS: 500.0000 mg | ORAL_TABLET | Freq: Three times a day (TID) | ORAL | 0 refills | Status: DC

## 2017-03-09 MED FILL — ?METRONIDAZOLE 500 MG TABLE: 500 | 7 days supply | Qty: 21 | Fill #0

## 2017-03-09 NOTE — Addendum Note (Signed)
Addended by: Bing NeighborsHARRIS, Harlea Goetzinger S on: 03/09/2017 08:30 AM   Modules accepted: Orders

## 2017-03-13 ENCOUNTER — Telehealth: Payer: Self-pay

## 2017-03-13 NOTE — Telephone Encounter (Signed)
Left a vm for patient to callback 

## 2017-03-13 NOTE — Telephone Encounter (Signed)
Patient states that she has been on the Effexor for 1 week and the ringing of the ears started yesterday. Patient did take her dosage of medication today. Please advise

## 2017-03-13 NOTE — Telephone Encounter (Signed)
Patient will come in next week to do a swab

## 2017-03-13 NOTE — Telephone Encounter (Signed)
The ringing in her ears is less likely from the Effexor and more likely from the metronidazole she is taking for treatment of trichomonas. Ringing in the ear is a side effect from abruptly stopping Effexor not typically from taking the medication. She can discontinue the metronidazole and come in next week for a nurse visit to perform a self swab to ensure that the infection has clear. Resume Effexor discontinue Metronidazole

## 2017-03-17 ENCOUNTER — Other Ambulatory Visit: Payer: Self-pay

## 2017-03-17 ENCOUNTER — Other Ambulatory Visit (HOSPITAL_COMMUNITY)
Admission: RE | Admit: 2017-03-17 | Discharge: 2017-03-17 | Disposition: A | Discharge status: Home / Self Care | Payer: No Typology Code available for payment source | Source: Ambulatory Visit | Attending: Family Medicine | Admitting: Family Medicine

## 2017-03-17 DIAGNOSIS — N898 Other specified noninflammatory disorders of vagina: Secondary | ICD-10-CM | POA: Insufficient documentation

## 2017-03-17 NOTE — Progress Notes (Signed)
Nurse visit only

## 2017-03-18 ENCOUNTER — Telehealth: Payer: Self-pay | Admitting: Family Medicine

## 2017-03-18 LAB — CYTOLOGY, (ORAL, ANAL, URETHRAL) ANCILLARY ONLY: WET PREP (BD AFFIRM): POSITIVE — AB

## 2017-03-18 MED ORDER — FLUCONAZOLE 150 MG PO TABS: 150.0000 mg | ORAL_TABLET | Freq: Once | ORAL | 0 refills | Status: AC

## 2017-03-18 NOTE — Telephone Encounter (Signed)
Trichomonas infection has cleared. She continues to have a small amount of yeast for which I will e-prescribe one dose of Diflucan 150 mg to treat or she can purchase an OTC intravaginal cream treatment.

## 2017-03-19 MED FILL — FLUCONAZOLE 150 MG TABLET: 150 | 1 days supply | Qty: 1 | Fill #0

## 2017-03-19 NOTE — Telephone Encounter (Signed)
Left vm for patient to call back.

## 2017-03-20 NOTE — Telephone Encounter (Signed)
Patient notified

## 2017-03-26 MED FILL — ?AMLODIPINE BESYLATE 10 MG: 10 | 30 days supply | Qty: 30 | Fill #0

## 2017-04-08 ENCOUNTER — Other Ambulatory Visit: Payer: Self-pay | Admitting: Family Medicine

## 2017-04-08 MED FILL — VENLAFAXINE HCL ER 37.5 MG: 37.5 | 30 days supply | Qty: 60 | Fill #1

## 2017-04-16 ENCOUNTER — Ambulatory Visit (INDEPENDENT_AMBULATORY_CARE_PROVIDER_SITE_OTHER): Payer: Self-pay | Admitting: Family Medicine

## 2017-04-16 ENCOUNTER — Encounter: Payer: Self-pay | Admitting: Family Medicine

## 2017-04-16 VITALS — BP 124/77 | HR 68 | Temp 98.6°F | Resp 14 | Ht 65.0 in | Wt 163.2 lb

## 2017-04-16 DIAGNOSIS — R232 Flushing: Secondary | ICD-10-CM

## 2017-04-16 MED ORDER — AMLODIPINE BESYLATE 10 MG PO TABS: 10.0000 mg | ORAL_TABLET | Freq: Every day | ORAL | 2 refills | Status: DC

## 2017-04-16 MED ORDER — VENLAFAXINE HCL ER 75 MG PO CP24: ORAL_CAPSULE | ORAL | 2 refills | Status: DC

## 2017-04-16 MED FILL — VENLAFAXINE HCL ER 75 MG CA: 75 | 30 days supply | Qty: 30 | Fill #0

## 2017-04-16 MED FILL — AMLODIPINE BESYLATE 10 MG T: 10 | 30 days supply | Qty: 30 | Fill #0

## 2017-04-16 NOTE — Progress Notes (Signed)
Patient ID: Meagan Barrett, female    DOB: 01-21-1961, 55 y.o.   MRN: [id number]  PCP: Bing Neighbors, FNP  Chief Complaint  Patient presents with  . Follow-up    Therapy for hot flashes    Subjective:  HPI Meagan Barrett is a 56 y.o. female presents for medication follow-up. Meagan Barrett presents today for follow-up of treatment for hot flashes. She was placed on Venlafaxine 03/05/2017. She was initially started on Venlafaxine 37.5 mg and was titrated up to 75 mg daily. She at one point thought She was experiencing a reaction to the medication, however the reaction she had was to metronidazole after ingesting alcohol. She reports today complete resolution of her hot flashes. She denies any side effects to medications and reports that she is tolerating the 75 mg daily dose and the dose is sufficient enough to keep her symptoms suppressed. She denies any other complaints or concerns today. Request a medication refill.     Social History   Social History  . Marital status: Single    Spouse name: N/A  . Number of children: N/A  . Years of education: N/A   Occupational History  . Not on file.   Social History Main Topics  . Smoking status: Current Some Day Smoker    Packs/day: 0.25    Types: Cigarettes  . Smokeless tobacco: Never Used  . Alcohol use Yes  . Drug use: Yes    Types: Marijuana  . Sexual activity: Not on file   Other Topics Concern  . Not on file   Social History Narrative  . No narrative on file    Family History  Problem Relation Age of Onset  . Family history unknown: Yes     Review of Systems  Patient Active Problem List   Diagnosis Date Noted  . Essential hypertension 03/14/2016    No Known Allergies  Prior to Admission medications   Medication Sig Start Date End Date Taking? Authorizing Provider  amLODipine (NORVASC) 10 MG tablet Take 1 tablet (10 mg total) by mouth daily. 01/22/17  Yes Bing Neighbors, FNP  metroNIDAZOLE (FLAGYL) 500 MG  tablet Take 1 tablet (500 mg total) by mouth 3 (three) times daily. 03/09/17  Yes Bing Neighbors, FNP  venlafaxine XR (EFFEXOR-XR) 37.5 MG 24 hr capsule TAKE 1 CAPSULE DAILY FOR 7 DAYS. DAY 8, INCREASE DOSE TO 2 CAPSULES DAILY. 04/08/17  Yes Bing Neighbors, FNP    Past Medical, Surgical Family and Social History reviewed and updated.    Objective:   Today's Vitals   04/16/17 0939  BP: 124/77  Pulse: 68  Resp: 14  Temp: 98.6 F (37 C)  TempSrc: Oral  SpO2: 98%  Weight: 163 lb 3.2 oz (74 kg)  Height: 5\' 5"  (1.651 m)    Wt Readings from Last 3 Encounters:  04/16/17 163 lb 3.2 oz (74 kg)  03/05/17 167 lb 9.6 oz (76 kg)  01/22/17 164 lb (74.4 kg)   Physical Exam  Constitutional: She is oriented to person, place, and time. She appears well-developed and well-nourished.  HENT:  Head: Normocephalic and atraumatic.  Neck: Normal range of motion. Neck supple.  Cardiovascular: Normal rate, regular rhythm, normal heart sounds and intact distal pulses.   Pulmonary/Chest: Effort normal and breath sounds normal.  Musculoskeletal: Normal range of motion.  Neurological: She is alert and oriented to person, place, and time.  Psychiatric: She has a normal mood and affect. Her behavior is normal. Judgment and  thought content normal.   Assessment & Plan:  1. Hot flashes -Continue venlafaxine 75 mg daily. Medication refill for complete 6 months.  RTC: 6 months for hypertension management .   Godfrey PickKimberly S. Tiburcio PeaHarris, MSN, FNP-C [facility]  [address] [place], [place] [address] [phone]

## 2017-05-26 MED FILL — VENLAFAXINE HCL ER 75 MG CA: 75 | 30 days supply | Qty: 30 | Fill #1

## 2017-05-26 MED FILL — AMLODIPINE BESYLATE 10 MG T: 10 | 30 days supply | Qty: 30 | Fill #1

## 2017-06-22 MED FILL — AMLODIPINE BESYLATE 10 MG T: 10 | 30 days supply | Qty: 30 | Fill #2

## 2017-07-07 MED FILL — VENLAFAXINE HCL ER 75 MG CA: 75 | 30 days supply | Qty: 30 | Fill #2

## 2017-07-24 MED FILL — AMLODIPINE BESYLATE 10 MG T: 10 | 30 days supply | Qty: 30 | Fill #3

## 2017-08-10 MED FILL — ESTRADIOL-NORETH 0.5-0.1 MG: 0.5-0.1 | 28 days supply | Qty: 28 | Fill #2

## 2017-08-11 MED FILL — VENLAFAXINE HCL ER 75 MG CA: 75 | 30 days supply | Qty: 30 | Fill #3

## 2017-08-21 MED FILL — AMLODIPINE BESYLATE 10 MG T: 10 | 30 days supply | Qty: 30 | Fill #4

## 2017-09-07 MED FILL — VENLAFAXINE HCL ER 75 MG CA: 75 | 30 days supply | Qty: 30 | Fill #4

## 2017-09-28 MED FILL — AMLODIPINE BESYLATE 10 MG T: 10 | 30 days supply | Qty: 30 | Fill #5 | Status: TO

## 2017-10-05 MED FILL — VENLAFAXINE HCL ER 75 MG CA: 75 | 30 days supply | Qty: 30 | Fill #5

## 2017-11-05 ENCOUNTER — Telehealth: Payer: Self-pay

## 2017-11-05 MED ORDER — AMLODIPINE BESYLATE 10 MG PO TABS: 10.0000 mg | ORAL_TABLET | Freq: Every day | ORAL | 0 refills | Status: DC

## 2017-11-06 MED FILL — AMLODIPINE BESYLATE 10 MG T: 10 | 30 days supply | Qty: 30 | Fill #0

## 2017-11-09 ENCOUNTER — Ambulatory Visit: Payer: No Typology Code available for payment source | Admitting: Family Medicine

## 2017-11-11 ENCOUNTER — Encounter: Payer: Self-pay | Admitting: Family Medicine

## 2017-11-11 ENCOUNTER — Ambulatory Visit (INDEPENDENT_AMBULATORY_CARE_PROVIDER_SITE_OTHER): Payer: Self-pay | Admitting: Family Medicine

## 2017-11-11 VITALS — BP 117/78 | HR 72 | Temp 98.8°F | Ht 65.0 in | Wt 168.0 lb

## 2017-11-11 DIAGNOSIS — Z13220 Encounter for screening for lipoid disorders: Secondary | ICD-10-CM

## 2017-11-11 DIAGNOSIS — I10 Essential (primary) hypertension: Secondary | ICD-10-CM

## 2017-11-11 DIAGNOSIS — N951 Menopausal and female climacteric states: Secondary | ICD-10-CM

## 2017-11-11 DIAGNOSIS — Z131 Encounter for screening for diabetes mellitus: Secondary | ICD-10-CM

## 2017-11-11 LAB — POCT URINALYSIS DIP (DEVICE)
BILIRUBIN URINE: NEGATIVE
GLUCOSE, UA: NEGATIVE mg/dL
KETONES UR: NEGATIVE mg/dL
Leukocytes, UA: NEGATIVE
Nitrite: NEGATIVE
PH: 6 (ref 5.0–8.0)
Protein, ur: NEGATIVE mg/dL
Specific Gravity, Urine: 1.02 (ref 1.005–1.030)
Urobilinogen, UA: 1 mg/dL (ref 0.0–1.0)

## 2017-11-11 LAB — POCT GLYCOSYLATED HEMOGLOBIN (HGB A1C): HEMOGLOBIN A1C: 5

## 2017-11-11 MED ORDER — VENLAFAXINE HCL ER 75 MG PO CP24: ORAL_CAPSULE | ORAL | 2 refills | Status: DC

## 2017-11-11 MED FILL — VENLAFAXINE HCL ER 75 MG CA: 75 | 90 days supply | Qty: 180 | Fill #0

## 2017-11-11 NOTE — Progress Notes (Signed)
Patient ID: Meagan Barrett, female    DOB: 1960-12-04, 57 y.o.   MRN: [id number]  PCP: Bing Neighbors, FNP  Chief Complaint  Patient presents with  . Follow-up    hypertension   . Hot Flashes    Subjective:  HPI Meagan Barrett is a 57 y.o. female with hypertension and post -menopausal hot flash syndrome presents for evaluation 6 month follow-up. Estalee reports no home monitoring of blood pressure, although reports adherence to prescribed antihypertension regimen. Reports no use of added table salt and reports efforts to adhere to a low-sodium diet overall.  She is no longer smoker.  Reports daily consistent weekly exercise. Current Body mass index is 27.96 kg/m. Denies any episodes of dizziness, headaches, shortness of breath, or chest pain. She reports continued night sweats intermittently.  Initially when she was prescribed Effexor night sweats completely resolved.  However will recent she is began to experience some night sweats followed by cold chills which are consistent with her post menopausal symptoms.  He would like to continue Effexor however would like to inquire about increasing her dose today. Social History   Socioeconomic History  . Marital status: Single    Spouse name: Not on file  . Number of children: Not on file  . Years of education: Not on file  . Highest education level: Not on file  Social Needs  . Financial resource strain: Not on file  . Food insecurity - worry: Not on file  . Food insecurity - inability: Not on file  . Transportation needs - medical: Not on file  . Transportation needs - non-medical: Not on file  Occupational History  . Not on file  Tobacco Use  . Smoking status: Current Some Day Smoker    Packs/day: 0.25    Types: Cigarettes  . Smokeless tobacco: Never Used  Substance and Sexual Activity  . Alcohol use: Yes  . Drug use: Yes    Types: Marijuana  . Sexual activity: Not on file  Other Topics Concern  . Not on file  Social  History Narrative  . Not on file    Family History  Patient reports no active knowledge of family history significant for heart disease, cancer, CVA, or respiratory conditions pertinent to her maternal or paternal side of the family.    Review of Systems Constitutional: Negative for fever, chills, diaphoresis, activity change, appetite change and fatigue. HENT: Negative for ear pain, nosebleeds, congestion, facial swelling, rhinorrhea, neck pain, neck stiffness and ear discharge.  Eyes: Negative for pain, discharge, redness, itching and visual disturbance. Respiratory: Negative for cough, choking, chest tightness, shortness of breath, wheezing and stridor.  Cardiovascular: Negative for chest pain, palpitations and leg swelling. Gastrointestinal: Negative for abdominal distention. Genitourinary: Negative for dysuria, urgency, frequency, hematuria, flank pain, decreased urine volume, difficulty urinating and dyspareunia.  Musculoskeletal: Negative for back pain, joint swelling, arthralgia and gait problem. Neurological: Negative for dizziness, tremors, seizures, syncope, facial asymmetry, speech difficulty, weakness, light-headedness, numbness and headaches.  Hematological: Negative for adenopathy. Does not bruise/bleed easily. Psychiatric/Behavioral: Negative for hallucinations, behavioral problems, confusion, dysphoric mood, decreased concentration and agitation.   Patient Active Problem List   Diagnosis Date Noted  . Essential hypertension 03/14/2016    No Known Allergies  Prior to Admission medications   Medication Sig Start Date End Date Taking? Authorizing Provider  amLODipine (NORVASC) 10 MG tablet Take 1 tablet (10 mg total) by mouth daily. 11/05/17  Yes Bing Neighbors, FNP  venlafaxine XR [facility])  75 MG 24 hr capsule Take 1 tablet daily 75 mg. 04/10/17  Yes Bing NeighborsHarris, Neima Lacross S, FNP    Past Medical, Surgical Family and Social History reviewed and updated.    Objective:    Today's Vitals   11/11/17 1005  BP: 117/78  Pulse: 72  Temp: 98.8 F (37.1 C)  TempSrc: Oral  SpO2: 100%  Weight: 168 lb (76.2 kg)  Height: 5\' 5"  (1.651 m)    Wt Readings from Last 3 Encounters:  11/11/17 168 lb (76.2 kg)  04/16/17 163 lb 3.2 oz (74 kg)  03/05/17 167 lb 9.6 oz (76 kg)   Physical Exam Constitutional: Patient appears well-developed and well-nourished. No distress. HENT: Normocephalic, atraumatic, External right and left ear normal. Oropharynx is clear and moist.  Eyes: Conjunctivae and EOM are normal. PERRLA, no scleral icterus. Neck: Normal ROM. Neck supple. No JVD. No tracheal deviation. No thyromegaly. CVS: RRR, S1/S2 +, no murmurs, no gallops, no carotid bruit.  Pulmonary: Effort and breath sounds normal, no stridor, rhonchi, wheezes, rales.  Abdominal: Soft. BS +, no distension, tenderness, rebound or guarding.  Musculoskeletal: Normal range of motion. No edema and no tenderness.  Lymphadenopathy: No lymphadenopathy noted, cervical, inguinal or axillary Neuro: Alert. Normal reflexes, muscle tone coordination. No cranial nerve deficit. Skin: Skin is warm and dry. No rash noted. Not diaphoretic. No erythema. No pallor. Psychiatric: Normal mood and affect. Behavior, judgment, thought content normal.  Assessment & Plan:  1. Essential hypertension, well-controlled and stable.  No changes to current medication regimen. We have discussed target BP range and blood pressure goal. I have advised patient to check BP regularly and to call us back or report to clinic if the numbers are consistently higher than 140/90. We discussed the importance of compliance with medical therapy and DASH diet recommended, consequences of uncontrolled hypertension discussed.   2. Hot flashes due to menopause, chronic, however worsening.  Will increase Effexor 150 mg until symptoms are well controlled.  At patient's discretion she can reduce back to 75 mg once daily once symptoms have  resolved for an extended period of time.   3. Screening for diabetes mellitus, 5.0 normal A1c.  Will repeat in 12 months.   4. Screening for hyperlipidemia, patient will return for fasting labs.  Lipid panel, CMP, CBC future status.    Meds ordered this encounter  Medications  . venlafaxine XR (EFFEXOR-XR) 75 MG 24 hr capsule    Sig: Take 1 tablet daily 150 mg once daily. Once symptoms are controlled, reduce dose to 75 mg daily.    Dispense:  180 capsule    Refill:  2    Order Specific Question:   Supervising Provider    Answer:   Quentin AngstJEGEDE, OLUGBEMIGA E [id number]]    Orders Placed This Encounter  Procedures  . POCT glycosylated hemoglobin (Hb A1C)  . POCT urinalysis dip (device)    Return for care in 6 months hypertension management.  Godfrey PickKimberly S. Tiburcio PeaHarris, MSN, FNP-C [facility]  [address] [place], [place] [phone]

## 2017-11-11 NOTE — Patient Instructions (Addendum)
I have increased your Effexor to 150 mg. Once your symptoms of hot flashes are controlled, you made reduce dose to 75 mg. If symptoms worsen or do not improve, return for care.    DASH Eating Plan DASH stands for "Dietary Approaches to Stop Hypertension." The DASH eating plan is a healthy eating plan that has been shown to reduce high blood pressure (hypertension). It may also reduce your risk for type 2 diabetes, heart disease, and stroke. The DASH eating plan may also help with weight loss. What are tips for following this plan? General guidelines  Avoid eating more than 2,300 mg (milligrams) of salt (sodium) a day. If you have hypertension, you may need to reduce your sodium intake to 1,500 mg a day.  Limit alcohol intake to no more than 1 drink a day for nonpregnant women and 2 drinks a day for men. One drink equals 12 oz of beer, 5 oz of wine, or 1 oz of hard liquor.  Work with your health care provider to maintain a healthy body weight or to lose weight. Ask what an ideal weight is for you.  Get at least 30 minutes of exercise that causes your heart to beat faster (aerobic exercise) most days of the week. Activities may include walking, swimming, or biking.  Work with your health care provider or diet and nutrition specialist (dietitian) to adjust your eating plan to your individual calorie needs. Reading food labels  Check food labels for the amount of sodium per serving. Choose foods with less than 5 percent of the Daily Value of sodium. Generally, foods with less than 300 mg of sodium per serving fit into this eating plan.  To find whole grains, look for the word "whole" as the first word in the ingredient list. Shopping  Buy products labeled as "low-sodium" or "no salt added."  Buy fresh foods. Avoid canned foods and premade or frozen meals. Cooking  Avoid adding salt when cooking. Use salt-free seasonings or herbs instead of table salt or sea salt. Check with your health  care provider or pharmacist before using salt substitutes.  Do not fry foods. Cook foods using healthy methods such as baking, boiling, grilling, and broiling instead.  Cook with heart-healthy oils, such as olive, canola, soybean, or sunflower oil. Meal planning   Eat a balanced diet that includes: ? 5 or more servings of fruits and vegetables each day. At each meal, try to fill half of your plate with fruits and vegetables. ? Up to 6-8 servings of whole grains each day. ? Less than 6 oz of lean meat, poultry, or fish each day. A 3-oz serving of meat is about the same size as a deck of cards. One egg equals 1 oz. ? 2 servings of low-fat dairy each day. ? A serving of nuts, seeds, or beans 5 times each week. ? Heart-healthy fats. Healthy fats called Omega-3 fatty acids are found in foods such as flaxseeds and coldwater fish, like sardines, salmon, and mackerel.  Limit how much you eat of the following: ? Canned or prepackaged foods. ? Food that is high in trans fat, such as fried foods. ? Food that is high in saturated fat, such as fatty meat. ? Sweets, desserts, sugary drinks, and other foods with added sugar. ? Full-fat dairy products.  Do not salt foods before eating.  Try to eat at least 2 vegetarian meals each week.  Eat more home-cooked food and less restaurant, buffet, and fast food.  When eating  at a restaurant, ask that your food be prepared with less salt or no salt, if possible. What foods are recommended? The items listed may not be a complete list. Talk with your dietitian about what dietary choices are best for you. Grains Whole-grain or whole-wheat bread. Whole-grain or whole-wheat pasta. Brown rice. [place]. Bulgur. Whole-grain and low-sodium cereals. Pita bread. Low-fat, low-sodium crackers. Whole-wheat flour tortillas. Vegetables Fresh or frozen vegetables (raw, steamed, roasted, or grilled). Low-sodium or reduced-sodium tomato and vegetable juice.  Low-sodium or reduced-sodium tomato sauce and tomato paste. Low-sodium or reduced-sodium canned vegetables. Fruits All fresh, dried, or frozen fruit. Canned fruit in natural juice (without added sugar). Meat and other protein foods Skinless chicken or [place]. Ground chicken or [place]. Pork with fat trimmed off. Fish and seafood. Egg whites. Dried beans, peas, or lentils. Unsalted nuts, nut butters, and seeds. Unsalted canned beans. Lean cuts of beef with fat trimmed off. Low-sodium, lean deli meat. Dairy Low-fat (1%) or fat-free (skim) milk. Fat-free, low-fat, or reduced-fat cheeses. Nonfat, low-sodium ricotta or cottage cheese. Low-fat or nonfat yogurt. Low-fat, low-sodium cheese. Fats and oils Soft margarine without trans fats. Vegetable oil. Low-fat, reduced-fat, or light mayonnaise and salad dressings (reduced-sodium). Canola, safflower, olive, soybean, and sunflower oils. Avocado. Seasoning and other foods Herbs. Spices. Seasoning mixes without salt. Unsalted popcorn and pretzels. Fat-free sweets. What foods are not recommended? The items listed may not be a complete list. Talk with your dietitian about what dietary choices are best for you. Grains Baked goods made with fat, such as croissants, muffins, or some breads. Dry pasta or rice meal packs. Vegetables Creamed or fried vegetables. Vegetables in a cheese sauce. Regular canned vegetables (not low-sodium or reduced-sodium). Regular canned tomato sauce and paste (not low-sodium or reduced-sodium). Regular tomato and vegetable juice (not low-sodium or reduced-sodium). Angie Fava. Olives. Fruits Canned fruit in a light or heavy syrup. Fried fruit. Fruit in cream or butter sauce. Meat and other protein foods Fatty cuts of meat. Ribs. Fried meat. Berniece Salines. Sausage. Bologna and other processed lunch meats. Salami. Fatback. Hotdogs. Bratwurst. Salted nuts and seeds. Canned beans with added salt. Canned or smoked fish. Whole eggs or egg yolks. Chicken  or [place] with skin. Dairy Whole or 2% milk, cream, and half-and-half. Whole or full-fat cream cheese. Whole-fat or sweetened yogurt. Full-fat cheese. Nondairy creamers. Whipped toppings. Processed cheese and cheese spreads. Fats and oils Butter. Stick margarine. Lard. Shortening. Ghee. Bacon fat. Tropical oils, such as coconut, palm kernel, or palm oil. Seasoning and other foods Salted popcorn and pretzels. Onion salt, garlic salt, seasoned salt, table salt, and sea salt. Worcestershire sauce. Tartar sauce. Barbecue sauce. Teriyaki sauce. Soy sauce, including reduced-sodium. Steak sauce. Canned and packaged gravies. Fish sauce. Oyster sauce. Cocktail sauce. Horseradish that you find on the shelf. Ketchup. Mustard. Meat flavorings and tenderizers. Bouillon cubes. Hot sauce and Tabasco sauce. Premade or packaged marinades. Premade or packaged taco seasonings. Relishes. Regular salad dressings. Where to find more information:  National Heart, Lung, and [place]: https://wilson-eaton.com/  American Heart Association: www.heart.org Summary  The DASH eating plan is a healthy eating plan that has been shown to reduce high blood pressure (hypertension). It may also reduce your risk for type 2 diabetes, heart disease, and stroke.  With the DASH eating plan, you should limit salt (sodium) intake to 2,300 mg a day. If you have hypertension, you may need to reduce your sodium intake to 1,500 mg a day.  When on the DASH eating plan, aim to  eat more fresh fruits and vegetables, whole grains, lean proteins, low-fat dairy, and heart-healthy fats.  Work with your health care provider or diet and nutrition specialist (dietitian) to adjust your eating plan to your individual calorie needs. This information is not intended to replace advice given to you by your health care provider. Make sure you discuss any questions you have with your health care provider. Document Released: 10/09/2011 Document Revised:  10/13/2016 Document Reviewed: 10/13/2016 Elsevier Interactive Patient Education  [employer].

## 2017-11-19 ENCOUNTER — Encounter: Payer: Self-pay | Admitting: Family Medicine

## 2017-11-19 MED ORDER — VENLAFAXINE HCL ER 75 MG PO CP24: ORAL_CAPSULE | ORAL | 2 refills | Status: DC

## 2017-12-02 MED FILL — AMLODIPINE BESYLATE 10 MG T: 10 | 90 days supply | Qty: 90 | Fill #0

## 2018-01-14 NOTE — Telephone Encounter (Signed)
Medication refilled

## 2018-02-09 ENCOUNTER — Other Ambulatory Visit: Payer: Self-pay

## 2018-02-09 DIAGNOSIS — Z13220 Encounter for screening for lipoid disorders: Secondary | ICD-10-CM

## 2018-02-09 DIAGNOSIS — N951 Menopausal and female climacteric states: Secondary | ICD-10-CM

## 2018-02-09 DIAGNOSIS — I10 Essential (primary) hypertension: Secondary | ICD-10-CM

## 2018-02-10 LAB — CBC WITH DIFFERENTIAL/PLATELET
BASOS: 1 %
Basophils Absolute: 0 10*3/uL (ref 0.0–0.2)
EOS (ABSOLUTE): 0.2 10*3/uL (ref 0.0–0.4)
Eos: 4 %
Hematocrit: 38.5 % (ref 34.0–46.6)
Hemoglobin: 12.6 g/dL (ref 11.1–15.9)
Immature Grans (Abs): 0 10*3/uL (ref 0.0–0.1)
Immature Granulocytes: 0 %
Lymphocytes Absolute: 2.7 10*3/uL (ref 0.7–3.1)
Lymphs: 43 %
MCH: 28.4 pg (ref 26.6–33.0)
MCHC: 32.7 g/dL (ref 31.5–35.7)
MCV: 87 fL (ref 79–97)
MONOS ABS: 0.5 10*3/uL (ref 0.1–0.9)
Monocytes: 8 %
Neutrophils Absolute: 2.7 10*3/uL (ref 1.4–7.0)
Neutrophils: 44 %
PLATELETS: 280 10*3/uL (ref 150–379)
RBC: 4.44 x10E6/uL (ref 3.77–5.28)
RDW: 14.7 % (ref 12.3–15.4)
WBC: 6.1 10*3/uL (ref 3.4–10.8)

## 2018-02-10 LAB — COMPREHENSIVE METABOLIC PANEL
A/G RATIO: 1.7 (ref 1.2–2.2)
ALBUMIN: 4.5 g/dL (ref 3.5–5.5)
ALT: 27 IU/L (ref 0–32)
AST: 18 IU/L (ref 0–40)
Alkaline Phosphatase: 72 IU/L (ref 39–117)
BUN / CREAT RATIO: 8 — AB (ref 9–23)
BUN: 9 mg/dL (ref 6–24)
Bilirubin Total: 0.7 mg/dL (ref 0.0–1.2)
CO2: 22 mmol/L (ref 20–29)
Calcium: 9.1 mg/dL (ref 8.7–10.2)
Chloride: 108 mmol/L — ABNORMAL HIGH (ref 96–106)
Creatinine, Ser: 1.06 mg/dL — ABNORMAL HIGH (ref 0.57–1.00)
GFR calc Af Amer: 69 mL/min/{1.73_m2} (ref >59)
GFR, EST NON AFRICAN AMERICAN: 60 mL/min/{1.73_m2} (ref >59)
GLOBULIN, TOTAL: 2.7 g/dL (ref 1.5–4.5)
Glucose: 80 mg/dL (ref 65–99)
POTASSIUM: 4.1 mmol/L (ref 3.5–5.2)
SODIUM: 144 mmol/L (ref 134–144)
TOTAL PROTEIN: 7.2 g/dL (ref 6.0–8.5)

## 2018-02-10 LAB — LIPID PANEL
CHOL/HDL RATIO: 1.9 ratio (ref 0.0–4.4)
Cholesterol, Total: 154 mg/dL (ref 100–199)
HDL: 81 mg/dL (ref >39)
LDL Calculated: 60 mg/dL (ref 0–99)
Triglycerides: 65 mg/dL (ref 0–149)
VLDL CHOLESTEROL CAL: 13 mg/dL (ref 5–40)

## 2018-02-13 MED FILL — VENLAFAXINE HCL ER 75 MG CA: 75 | 90 days supply | Qty: 180 | Fill #1

## 2018-03-04 ENCOUNTER — Other Ambulatory Visit: Payer: Self-pay | Admitting: Family Medicine

## 2018-03-04 MED FILL — AMLODIPINE BESYLATE 10 MG T: 10 | 90 days supply | Qty: 90 | Fill #0

## 2018-05-11 ENCOUNTER — Ambulatory Visit: Payer: No Typology Code available for payment source | Admitting: Family Medicine

## 2018-06-11 MED FILL — AMLODIPINE BESYLATE 10 MG T: 10 | 90 days supply | Qty: 90 | Fill #1

## 2018-06-11 MED FILL — VENLAFAXINE HCL ER 75 MG CA: 75 | 30 days supply | Qty: 60 | Fill #2

## 2018-07-21 MED FILL — VENLAFAXINE HCL ER 75 MG CA: 75 | 30 days supply | Qty: 60 | Fill #3

## 2018-09-07 ENCOUNTER — Other Ambulatory Visit: Payer: Self-pay | Admitting: Internal Medicine

## 2018-09-08 NOTE — Telephone Encounter (Signed)
Will route to PCP for approval.

## 2018-09-09 ENCOUNTER — Telehealth: Payer: Self-pay

## 2018-09-09 ENCOUNTER — Other Ambulatory Visit: Payer: Self-pay | Admitting: Family Medicine

## 2018-09-09 NOTE — Telephone Encounter (Signed)
Patient scheduled.

## 2018-09-09 NOTE — Telephone Encounter (Signed)
-----   Message from Kallie Locks, FNP sent at 09/08/2018 10:52 PM EST ----- Regarding: "Refill Request" Patient requesting refill on Norvasc. She needs office visit.    Lyla Son,   Please contact patient to remind her that it is important for her to follow up. I have never seen patient, and she has had several cancellations. Possibly transportation problems? Remind her that she needs to be assessed in office with labs, blood pressures, etc in order to continue to get refills on medications to ensure that his meds continue to be safe for her.    Thanks.

## 2018-09-09 NOTE — Telephone Encounter (Signed)
-----   Message from Natalie M Stroud, FNP sent at 09/08/2018 10:52 PM EST ----- Regarding: "Refill Request" Patient requesting refill on Norvasc. She needs office visit.    Carrie,   Please contact patient to remind her that it is important for her to follow up. I have never seen patient, and she has had several cancellations. Possibly transportation problems? Remind her that she needs to be assessed in office with labs, blood pressures, etc in order to continue to get refills on medications to ensure that his meds continue to be safe for her.    Thanks.  

## 2018-09-09 NOTE — Telephone Encounter (Signed)
Left a vm for patient to callback 

## 2018-09-10 MED FILL — AMLODIPINE BESYLATE 10 MG T: 10 | 90 days supply | Qty: 90 | Fill #0

## 2018-09-22 ENCOUNTER — Ambulatory Visit (INDEPENDENT_AMBULATORY_CARE_PROVIDER_SITE_OTHER): Payer: Self-pay | Admitting: Family Medicine

## 2018-09-22 ENCOUNTER — Encounter: Payer: Self-pay | Admitting: Family Medicine

## 2018-09-22 VITALS — BP 124/74 | HR 68 | Temp 98.0°F | Ht 65.0 in | Wt 167.0 lb

## 2018-09-22 DIAGNOSIS — Z09 Encounter for follow-up examination after completed treatment for conditions other than malignant neoplasm: Secondary | ICD-10-CM

## 2018-09-22 DIAGNOSIS — Z131 Encounter for screening for diabetes mellitus: Secondary | ICD-10-CM

## 2018-09-22 DIAGNOSIS — I10 Essential (primary) hypertension: Secondary | ICD-10-CM

## 2018-09-22 DIAGNOSIS — R232 Flushing: Secondary | ICD-10-CM

## 2018-09-22 LAB — POCT URINALYSIS DIP (MANUAL ENTRY)
Bilirubin, UA: NEGATIVE
Glucose, UA: NEGATIVE mg/dL
Ketones, POC UA: NEGATIVE mg/dL
Leukocytes, UA: NEGATIVE
Nitrite, UA: NEGATIVE
Protein Ur, POC: NEGATIVE mg/dL
Spec Grav, UA: 1.02 (ref 1.010–1.025)
Urobilinogen, UA: 0.2 E.U./dL
pH, UA: 5.5 (ref 5.0–8.0)

## 2018-09-22 LAB — POCT GLYCOSYLATED HEMOGLOBIN (HGB A1C): Hemoglobin A1C: 5 % (ref 4.0–5.6)

## 2018-09-22 NOTE — Progress Notes (Signed)
Follow Up  Subjective:    Patient ID: Meagan Barrett, female    DOB: 07/28/1961, 57 y.o.   MRN: [id number]   Chief Complaint  Patient presents with  . Follow-up    Medication refills    HPI  Meagan Barrett is a 57 year old female with a past medical history of Hypertension. She is here today for follow up.   Current Status: Since her last office visit, she is doing well with no complaints. She denies visual changes, chest pain, cough, shortness of breath, heart palpitations, and falls. She has occasionally headaches and dizziness with position changes. Denies severe headaches, confusion, seizures, double vision, and blurred vision, nausea and vomiting.  She denies fevers, chills, recent infections, weight loss, and night sweats. She No reports of GI problems such as nausea, vomiting, diarrhea, and constipation. She has no reports of blood in stools, dysuria and hematuria. No depression or anxiety reported. She denies pain today.   Past Medical History:  Diagnosis Date  . Hypertension     No family history on file.  Social History   Socioeconomic History  . Marital status: Single    Spouse name: Not on file  . Number of children: Not on file  . Years of education: Not on file  . Highest education level: Not on file  Occupational History  . Not on file  Social Needs  . Financial resource strain: Not on file  . Food insecurity:    Worry: Not on file    Inability: Not on file  . Transportation needs:    Medical: Not on file    Non-medical: Not on file  Tobacco Use  . Smoking status: Former Smoker    Packs/day: 0.25    Types: Cigarettes  . Smokeless tobacco: Never Used  Substance and Sexual Activity  . Alcohol use: Yes  . Drug use: Yes    Types: Marijuana  . Sexual activity: Not on file  Lifestyle  . Physical activity:    Days per week: Not on file    Minutes per session: Not on file  . Stress: Not on file  Relationships  . Social connections:    Talks on phone:  Not on file    Gets together: Not on file    Attends religious service: Not on file    Active member of club or organization: Not on file    Attends meetings of clubs or organizations: Not on file    Relationship status: Not on file  . Intimate partner violence:    Fear of current or ex partner: Not on file    Emotionally abused: Not on file    Physically abused: Not on file    Forced sexual activity: Not on file  Other Topics Concern  . Not on file  Social History Narrative  . Not on file    Past Surgical History:  Procedure Laterality Date  . CESAREAN SECTION      Immunization History  Administered Date(s) Administered  . Tdap 06/07/2015, 12/13/2015    Current Meds  Medication Sig  . amLODipine (NORVASC) 10 MG tablet TAKE 1 TABLET BY MOUTH ONCE DAILY  . venlafaxine XR (EFFEXOR-XR) 75 MG 24 hr capsule Take 2 tablet daily to equal 150 mg once daily. Once symptoms are controlled, reduce dose to 1 tablet daily,  75 mg daily.    No Known Allergies  BP 124/74 (BP Location: Left Arm, Patient Position: Sitting, Cuff Size: Small)   Pulse 68  Temp 98 F (36.7 C) (Oral)   Ht 5\' 5"  (1.651 m)   Wt 167 lb (75.8 kg)   LMP 05/28/2015   SpO2 100%   BMI 27.79 kg/m    Review of Systems  Constitutional: Negative.   HENT: Negative.   Eyes: Negative.   Respiratory: Negative.   Cardiovascular: Negative.   Gastrointestinal: Negative.   Endocrine: Negative.   Genitourinary: Negative.   Musculoskeletal: Negative.   Skin: Negative.   Neurological: Positive for dizziness and headaches.  Hematological: Negative.   Psychiatric/Behavioral: Negative.    Objective:   Physical Exam  Constitutional: She is oriented to person, place, and time. She appears well-developed and well-nourished.  HENT:  Head: Normocephalic and atraumatic.  Eyes: Pupils are equal, round, and reactive to light. Conjunctivae and EOM are normal.  Neck: Normal range of motion. Neck supple.  Cardiovascular:  Normal rate, regular rhythm, normal heart sounds and intact distal pulses.  Pulmonary/Chest: Effort normal and breath sounds normal.  Abdominal: Soft. Bowel sounds are normal.  Musculoskeletal: Normal range of motion.  Neurological: She is alert and oriented to person, place, and time.  Skin: Skin is warm and dry.  Psychiatric: She has a normal mood and affect. Her behavior is normal. Judgment and thought content normal.  Nursing note and vitals reviewed.  Assessment & Plan:   1. Essential hypertension Antihypertensive medication is effective. Blood pressure is 124/74 today. Continue Amlodipine as prescribed. She will continue to decrease high sodium intake, excessive alcohol intake, increase potassium intake, smoking cessation, and increase physical activity of at least 30 minutes of cardio activity daily. She will continue to follow Heart Healthy or DASH diet.  2. Hot flashes Stable.   3. Screening for diabetes mellitus Hgb A1c is within normal range of 5.0.  She will continue to decrease foods/beverages high in sugars and carbs and follow Heart Healthy or DASH diet. Increase physical activity to at least 30 minutes cardio exercise daily.  - POCT urinalysis dipstick - POCT glycosylated hemoglobin (Hb A1C)  4. Follow up She will follow up in 1 year.  No orders of the defined types were placed in this encounter.  Raliegh Ip,  MSN, FNP-C Patient Care [facility] [address] [place], [place] [address] [phone]

## 2018-10-06 MED FILL — VENLAFAXINE HCL ER 75 MG CA: 75 | 30 days supply | Qty: 60 | Fill #4

## 2018-11-04 ENCOUNTER — Other Ambulatory Visit: Payer: Self-pay | Admitting: Family Medicine

## 2018-12-20 ENCOUNTER — Other Ambulatory Visit: Payer: Self-pay | Admitting: Family Medicine

## 2018-12-20 MED FILL — AMLODIPINE BESYLATE 10 MG T: 10 | 90 days supply | Qty: 90 | Fill #1

## 2019-01-05 MED FILL — VENLAFAXINE HCL ER 75 MG CA: 75 | 30 days supply | Qty: 60 | Fill #0

## 2019-03-25 ENCOUNTER — Other Ambulatory Visit: Payer: Self-pay | Admitting: Family Medicine

## 2019-03-30 ENCOUNTER — Telehealth: Payer: Self-pay

## 2019-03-31 MED ORDER — AMLODIPINE BESYLATE 10 MG PO TABS: 10.0000 mg | ORAL_TABLET | Freq: Every day | ORAL | 1 refills | Status: DC

## 2019-03-31 MED FILL — AMLODIPINE BESYLATE 10 MG T: 10 | 90 days supply | Qty: 90 | Fill #0

## 2019-03-31 NOTE — Telephone Encounter (Signed)
Patient notified

## 2019-04-12 MED FILL — VENLAFAXINE HCL ER 75 MG CA: 75 | 30 days supply | Qty: 60 | Fill #1

## 2019-04-25 ENCOUNTER — Telehealth: Payer: Self-pay

## 2019-04-25 NOTE — Telephone Encounter (Signed)
Left a vm for patient to callback and schedule a follow up appointment

## 2019-04-25 NOTE — Telephone Encounter (Signed)
-----   Message from Azzie Glatter, [place] sent at 04/25/2019  8:24 AM EDT ----- Regarding: "Follow Up" Please schedule patient for follow up within the next 3 months.

## 2019-04-28 NOTE — Telephone Encounter (Signed)
-----   Message from Natalie M Stroud, FNP sent at 04/25/2019  8:24 AM EDT ----- Regarding: "Follow Up" Please schedule patient for follow up within the next 3 months.   

## 2019-04-28 NOTE — Telephone Encounter (Signed)
Patient scheduled for September

## 2019-04-28 NOTE — Telephone Encounter (Signed)
Left a vm for patient to callback and schedule appointment 

## 2019-06-16 MED FILL — VENLAFAXINE HCL ER 75 MG CA: 75 | 30 days supply | Qty: 60 | Fill #2

## 2019-07-06 MED FILL — AMLODIPINE BESYLATE 10 MG T: 10 | 90 days supply | Qty: 90 | Fill #1

## 2019-07-29 ENCOUNTER — Ambulatory Visit: Payer: No Typology Code available for payment source | Admitting: Family Medicine

## 2019-08-23 MED FILL — VENLAFAXINE HCL ER 75 MG CA: 75 | 30 days supply | Qty: 60 | Fill #3

## 2019-10-04 DIAGNOSIS — E559 Vitamin D deficiency, unspecified: Secondary | ICD-10-CM

## 2019-10-04 HISTORY — DX: Vitamin D deficiency, unspecified: E55.9

## 2019-10-12 ENCOUNTER — Other Ambulatory Visit: Payer: Self-pay | Admitting: Family Medicine

## 2019-10-12 MED FILL — VENLAFAXINE HCL ER 75 MG CA: 75 | 30 days supply | Qty: 60 | Fill #4

## 2019-10-12 MED FILL — AMLODIPINE BESYLATE 10 MG T: 10 | 90 days supply | Qty: 90 | Fill #0

## 2019-11-02 ENCOUNTER — Ambulatory Visit (INDEPENDENT_AMBULATORY_CARE_PROVIDER_SITE_OTHER): Payer: No Typology Code available for payment source | Admitting: Family Medicine

## 2019-11-02 ENCOUNTER — Encounter: Payer: Self-pay | Admitting: Family Medicine

## 2019-11-02 ENCOUNTER — Other Ambulatory Visit: Payer: Self-pay

## 2019-11-02 VITALS — BP 133/78 | HR 67 | Temp 97.7°F | Ht 65.0 in | Wt 165.4 lb

## 2019-11-02 DIAGNOSIS — K047 Periapical abscess without sinus: Secondary | ICD-10-CM

## 2019-11-02 DIAGNOSIS — I10 Essential (primary) hypertension: Secondary | ICD-10-CM

## 2019-11-02 DIAGNOSIS — K029 Dental caries, unspecified: Secondary | ICD-10-CM

## 2019-11-02 DIAGNOSIS — R8290 Unspecified abnormal findings in urine: Secondary | ICD-10-CM

## 2019-11-02 DIAGNOSIS — Z09 Encounter for follow-up examination after completed treatment for conditions other than malignant neoplasm: Secondary | ICD-10-CM

## 2019-11-02 DIAGNOSIS — Z0000 Encounter for general adult medical examination without abnormal findings: Secondary | ICD-10-CM

## 2019-11-02 DIAGNOSIS — Z1231 Encounter for screening mammogram for malignant neoplasm of breast: Secondary | ICD-10-CM

## 2019-11-02 LAB — POCT URINALYSIS DIPSTICK
Bilirubin, UA: NEGATIVE
Glucose, UA: NEGATIVE
Ketones, UA: NEGATIVE
Nitrite, UA: NEGATIVE
Protein, UA: NEGATIVE
Spec Grav, UA: 1.02 (ref 1.010–1.025)
Urobilinogen, UA: 0.2 E.U./dL
pH, UA: 7 (ref 5.0–8.0)

## 2019-11-02 LAB — POCT GLYCOSYLATED HEMOGLOBIN (HGB A1C): Hemoglobin A1C: 5.1 % (ref 4.0–5.6)

## 2019-11-02 NOTE — Progress Notes (Signed)
Patient Meagan Barrett [facility]  Established Patient Office Visit  Subjective:  Patient ID: Meagan Barrett, female    DOB: 1961/02/25  Age: 58 y.o. MRN: [id number]  CC:  Chief Complaint  Patient presents with  . Follow-up    HTN    HPI Meagan Barrett is a 58 year old female who presents for Follow Up today .  Past Medical History:  Diagnosis Date  . Hypertension    Current Status: Since her last office visit, she has c/o mild dental pain, but has not been taking any medications for relief. She denies visual changes, chest pain, cough, shortness of breath, heart palpitations, and falls. She has occasional headaches and dizziness with position changes. Denies severe headaches, confusion, seizures, double vision, and blurred vision, nausea and vomiting. Her anxiety is mild today. She states that she has been taking minimal dosage of Effexor and is contemplating stopping medication. She denies suicidal ideations, homicidal ideations, or auditory hallucinations. She denies fevers, chills, fatigue, recent infections, weight loss, and night sweats. No reports of GI problems such as diarrhea, and constipation. She has no reports of blood in stools, dysuria and hematuria. She denies pain today.   Past Surgical History:  Procedure Laterality Date  . CESAREAN SECTION      Family History  Family history unknown: Yes    Social History   Socioeconomic History  . Marital status: Single    Spouse name: Not on file  . Number of children: Not on file  . Years of education: Not on file  . Highest education level: Not on file  Occupational History  . Not on file  Tobacco Use  . Smoking status: Former Smoker    Packs/day: 0.25    Types: Cigarettes  . Smokeless tobacco: Never Used  Substance and Sexual Activity  . Alcohol use: Yes  . Drug use: Yes    Types: Marijuana  . Sexual activity: Yes  Other Topics Concern  . Not on file  Social History Narrative    . Not on file   Social Determinants of Health   Financial Resource Strain:   . Difficulty of Paying Living Expenses: Not on file  Food Insecurity:   . Worried About Charity fundraiser in the Last Year: Not on file  . Ran Out of Food in the Last Year: Not on file  Transportation Needs:   . Lack of Transportation (Medical): Not on file  . Lack of Transportation (Non-Medical): Not on file  Physical Activity:   . Days of Exercise per Week: Not on file  . Minutes of Exercise per Session: Not on file  Stress:   . Feeling of Stress : Not on file  Social Connections:   . Frequency of Communication with Friends and Family: Not on file  . Frequency of Social Gatherings with Friends and Family: Not on file  . Attends Religious Services: Not on file  . Active Member of Clubs or Organizations: Not on file  . Attends Archivist Meetings: Not on file  . Marital Status: Not on file  Intimate Partner Violence:   . Fear of Current or Ex-Partner: Not on file  . Emotionally Abused: Not on file  . Physically Abused: Not on file  . Sexually Abused: Not on file    Outpatient Medications Prior to Visit  Medication Sig Dispense Refill  . amLODipine (NORVASC) 10 MG tablet TAKE 1 TABLET (10 MG TOTAL) BY MOUTH DAILY. [place]  tablet 1  . venlafaxine XR (EFFEXOR-XR) 75 MG 24 hr capsule TAKE 2 CAPSULES BY MOUTH ONCE DAILY. ONCE SYMPTOMS ARE CONTROLLED REDUCE DOSE TO 75 MG (1 CAPSULE) DAILY 180 capsule 2   No facility-administered medications prior to visit.    No Known Allergies  ROS Review of Systems  Constitutional: Negative.   HENT: Negative.   Eyes: Negative.   Respiratory: Negative.   Cardiovascular: Negative.   Gastrointestinal: Negative.   Endocrine: Negative.   Genitourinary: Negative.   Musculoskeletal: Negative.   Skin: Negative.   Allergic/Immunologic: Negative.   Neurological: Positive for dizziness (occasional ) and headaches (occasional ).  Hematological: Negative.    Psychiatric/Behavioral: Negative.       Objective:    Physical Exam  Constitutional: She is oriented to person, place, and time. She appears well-developed and well-nourished.  HENT:  Head: Normocephalic and atraumatic.  Eyes: Conjunctivae are normal.  Cardiovascular: Normal rate, regular rhythm, normal heart sounds and intact distal pulses.  Pulmonary/Chest: Effort normal and breath sounds normal.  Abdominal: Soft. Bowel sounds are normal.  Musculoskeletal:        General: Normal range of motion.     Cervical back: Normal range of motion and neck supple.  Neurological: She is alert and oriented to person, place, and time. She has normal reflexes.  Skin: Skin is warm and dry.  Psychiatric: She has a normal mood and affect. Her behavior is normal. Judgment and thought content normal.  Nursing note and vitals reviewed.   BP 133/78   Pulse 67   Temp 97.7 F (36.5 C) (Oral)   Ht '5\' 5"'  (1.651 m)   Wt 165 lb 6.4 oz (75 kg)   LMP 05/28/2015   SpO2 99%   BMI 27.52 kg/m  Wt Readings from Last 3 Encounters:  11/02/19 165 lb 6.4 oz (75 kg)  09/22/18 167 lb (75.8 kg)  11/11/17 168 lb (76.2 kg)     Health Maintenance Due  Topic Date Due  . Hepatitis C Screening  25-Dec-1960  . HIV Screening  06/10/1978  . COLONOSCOPY  06/10/2013  . MAMMOGRAM  12/26/2017  . INFLUENZA VACCINE  06/04/2019    There are no preventive care reminders to display for this patient.  Lab Results  Component Value Date   TSH 1.87 12/13/2015   Lab Results  Component Value Date   WBC 6.1 02/09/2018   HGB 12.6 02/09/2018   HCT 38.5 02/09/2018   MCV 87 02/09/2018   PLT 280 02/09/2018   Lab Results  Component Value Date   NA 144 02/09/2018   K 4.1 02/09/2018   CO2 22 02/09/2018   GLUCOSE 80 02/09/2018   BUN 9 02/09/2018   CREATININE 1.06 (H) 02/09/2018   BILITOT 0.7 02/09/2018   ALKPHOS 72 02/09/2018   AST 18 02/09/2018   ALT 27 02/09/2018   PROT 7.2 02/09/2018   ALBUMIN 4.5 02/09/2018    CALCIUM 9.1 02/09/2018   Lab Results  Component Value Date   CHOL 154 02/09/2018   Lab Results  Component Value Date   HDL 81 02/09/2018   Lab Results  Component Value Date   LDLCALC 60 02/09/2018   Lab Results  Component Value Date   TRIG 65 02/09/2018   Lab Results  Component Value Date   CHOLHDL 1.9 02/09/2018   Lab Results  Component Value Date   HGBA1C 5.1 11/02/2019   Assessment & Plan:   1. Essential hypertension The current medical regimen is effective; blood pressure is  stable at 133/78 today; continue present plan and medications as prescribed. He will continue to take medications as prescribed, to decrease high sodium intake, excessive alcohol intake, increase potassium intake, smoking cessation, and increase physical activity of at least 30 minutes of cardio activity daily. She will continue to follow Heart Healthy or DASH diet. - Urinalysis Dipstick - Lipid Panel - CBC with Differential - Comp Met (CMET) - TSH - Vitamin B12 - Vitamin D, 25-hydroxy - MM DIGITAL SCREENING BILATERAL; Future  2. Dental abscess  3. Pain due to dental caries Mild. She will follow up with Dentist asap.   4. Healthcare maintenance - POCT HgB A1C  5. Abnormal urinalysis Results are pending.  - Urine Culture  6. Encounter for screening mammogram for malignant neoplasm of breast Order placed for Mammogram today.   7. Follow up She will follow up in 1 year.   No orders of the defined types were placed in this encounter.   Orders Placed This Encounter  Procedures  . Urine Culture  . MM DIGITAL SCREENING BILATERAL  . Lipid Panel  . CBC with Differential  . Comp Met (CMET)  . TSH  . Vitamin B12  . Vitamin D, 25-hydroxy  . Urinalysis Dipstick  . POCT HgB A1C    Referral Orders  No referral(s) requested today    Kathe Becton,  MSN, FNP-BC [facility] [address] [place],   [address] [phone]- fax  Problem List Items Addressed This Visit      Cardiovascular and Mediastinum   Essential hypertension - Primary   Relevant Orders   Urinalysis Dipstick (Completed)   Lipid Panel   CBC with Differential   Comp Met (CMET)   TSH   Vitamin B12   Vitamin D, 25-hydroxy   MM DIGITAL SCREENING BILATERAL    Other Visit Diagnoses    Dental abscess       Pain due to dental caries       Healthcare maintenance       Relevant Orders   POCT HgB A1C (Completed)   Abnormal urinalysis       Relevant Orders   Urine Culture   Encounter for screening mammogram for malignant neoplasm of breast       Follow up          No orders of the defined types were placed in this encounter.   Follow-up: Return in about 1 year (around 11/01/2020).    Azzie Glatter, FNP

## 2019-11-03 LAB — COMPREHENSIVE METABOLIC PANEL
ALT: 14 IU/L (ref 0–32)
AST: 18 IU/L (ref 0–40)
Albumin/Globulin Ratio: 1.8 (ref 1.2–2.2)
Albumin: 4.6 g/dL (ref 3.8–4.9)
Alkaline Phosphatase: 77 IU/L (ref 39–117)
BUN/Creatinine Ratio: 10 (ref 9–23)
BUN: 9 mg/dL (ref 6–24)
Bilirubin Total: 0.7 mg/dL (ref 0.0–1.2)
CO2: 25 mmol/L (ref 20–29)
Calcium: 9.3 mg/dL (ref 8.7–10.2)
Chloride: 103 mmol/L (ref 96–106)
Creatinine, Ser: 0.86 mg/dL (ref 0.57–1.00)
GFR calc Af Amer: 87 mL/min/{1.73_m2} (ref >59)
GFR calc non Af Amer: 76 mL/min/{1.73_m2} (ref >59)
Globulin, Total: 2.6 g/dL (ref 1.5–4.5)
Glucose: 79 mg/dL (ref 65–99)
Potassium: 4.2 mmol/L (ref 3.5–5.2)
Sodium: 140 mmol/L (ref 134–144)
Total Protein: 7.2 g/dL (ref 6.0–8.5)

## 2019-11-03 LAB — CBC WITH DIFFERENTIAL/PLATELET
Basophils Absolute: 0.1 10*3/uL (ref 0.0–0.2)
Basos: 1 %
EOS (ABSOLUTE): 0.3 10*3/uL (ref 0.0–0.4)
Eos: 5 %
Hematocrit: 37.8 % (ref 34.0–46.6)
Hemoglobin: 12.7 g/dL (ref 11.1–15.9)
Immature Grans (Abs): 0 10*3/uL (ref 0.0–0.1)
Immature Granulocytes: 0 %
Lymphocytes Absolute: 2.7 10*3/uL (ref 0.7–3.1)
Lymphs: 44 %
MCH: 28.9 pg (ref 26.6–33.0)
MCHC: 33.6 g/dL (ref 31.5–35.7)
MCV: 86 fL (ref 79–97)
Monocytes Absolute: 0.5 10*3/uL (ref 0.1–0.9)
Monocytes: 9 %
Neutrophils Absolute: 2.4 10*3/uL (ref 1.4–7.0)
Neutrophils: 41 %
Platelets: 242 10*3/uL (ref 150–450)
RBC: 4.39 x10E6/uL (ref 3.77–5.28)
RDW: 13.8 % (ref 11.7–15.4)
WBC: 6 10*3/uL (ref 3.4–10.8)

## 2019-11-03 LAB — VITAMIN D 25 HYDROXY (VIT D DEFICIENCY, FRACTURES): Vit D, 25-Hydroxy: 13.3 ng/mL — ABNORMAL LOW (ref 30.0–100.0)

## 2019-11-03 LAB — VITAMIN B12: Vitamin B-12: 849 pg/mL (ref 232–1245)

## 2019-11-03 LAB — LIPID PANEL
Chol/HDL Ratio: 2 ratio (ref 0.0–4.4)
Cholesterol, Total: 172 mg/dL (ref 100–199)
HDL: 84 mg/dL (ref >39)
LDL Chol Calc (NIH): 75 mg/dL (ref 0–99)
Triglycerides: 65 mg/dL (ref 0–149)
VLDL Cholesterol Cal: 13 mg/dL (ref 5–40)

## 2019-11-03 LAB — TSH: TSH: 1.68 u[IU]/mL (ref 0.450–4.500)

## 2019-11-04 LAB — URINE CULTURE

## 2019-11-06 ENCOUNTER — Other Ambulatory Visit: Payer: Self-pay | Admitting: Family Medicine

## 2019-11-06 ENCOUNTER — Encounter: Payer: Self-pay | Admitting: Family Medicine

## 2019-11-06 DIAGNOSIS — E559 Vitamin D deficiency, unspecified: Secondary | ICD-10-CM

## 2019-11-06 MED ORDER — VITAMIN D (ERGOCALCIFEROL) 1.25 MG (50000 UNIT) PO CAPS: 50000.0000 [IU] | ORAL_CAPSULE | ORAL | 6 refills | Status: AC

## 2019-11-07 ENCOUNTER — Telehealth: Payer: Self-pay

## 2019-11-07 MED FILL — VIT D2 1.25 MG (50,000 UNIT: 1.25 MG | 28 days supply | Qty: 4 | Fill #0

## 2019-11-07 NOTE — Telephone Encounter (Signed)
-----   Message from Kallie Locks, FNP sent at 11/06/2019  8:00 PM EST ----- Vitamin D level is moderately decreased. Rx for Vitamin D supplement sent to pharmacy today. She should include foods that are high in Vitamin D. These include: Salmon, Cod Liver Oil, Mushrooms, Canned Fish, Milk, and Egg Yolks.  All other labs are stable. Keep follow up appointment. Please inform patient. Thank you.

## 2019-11-07 NOTE — Telephone Encounter (Signed)
Patient not available to receive test results. Message left for call back.

## 2019-11-07 NOTE — Telephone Encounter (Signed)
Patient returned call from [place]. I advised that vitamin D was decreased and that Rx for vitamin D once weekly was sent to pharmacy. Asked that patient increase vitamin D rich foods in died and gave examples listed. Advised that all other labs were stable and to keep follow up appointment. Thanks!

## 2019-12-06 MED FILL — VIT D2 1.25 MG (50,000 UNIT: 1.25 MG | 28 days supply | Qty: 4 | Fill #1

## 2020-01-12 ENCOUNTER — Other Ambulatory Visit: Payer: Self-pay | Admitting: Family Medicine

## 2020-01-12 MED FILL — VIT D2 1.25 MG (50,000 UNIT: 1.25 MG | 28 days supply | Qty: 4 | Fill #2

## 2020-01-12 MED FILL — VENLAFAXINE HCL ER 75 MG CA: 75 | 30 days supply | Qty: 60 | Fill #0

## 2020-01-12 MED FILL — AMLODIPINE BESYLATE 10 MG T: 10 | 90 days supply | Qty: 90 | Fill #1

## 2020-02-14 MED FILL — VIT D2 1.25 MG (50,000 UNIT: 1.25 MG | 28 days supply | Qty: 4 | Fill #3

## 2020-03-26 MED FILL — VENLAFAXINE HCL ER 75 MG CA: 75 | 30 days supply | Qty: 60 | Fill #1

## 2020-03-26 MED FILL — VIT D2 1.25 MG (50,000 UNIT: 1.25 MG | 28 days supply | Qty: 4 | Fill #4

## 2020-05-03 ENCOUNTER — Other Ambulatory Visit: Payer: Self-pay | Admitting: Family Medicine

## 2020-05-03 MED FILL — VIT D2 1.25 MG (50,000 UNIT: 1.25 MG | 28 days supply | Qty: 4 | Fill #5

## 2020-05-03 MED FILL — AMLODIPINE BESYLATE 10 MG T: 10 | 30 days supply | Qty: 30 | Fill #0

## 2020-05-03 MED FILL — VENLAFAXINE HCL ER 75 MG CA: 75 | 30 days supply | Qty: 60 | Fill #2

## 2020-05-31 ENCOUNTER — Other Ambulatory Visit: Payer: Self-pay | Admitting: Family Medicine

## 2020-05-31 MED FILL — AMLODIPINE BESYLATE 10 MG T: 10 | 30 days supply | Qty: 30 | Fill #0

## 2020-05-31 MED FILL — VIT D2 1.25 MG (50,000 UNIT: 1.25 MG | 28 days supply | Qty: 4 | Fill #6

## 2020-07-10 ENCOUNTER — Other Ambulatory Visit: Payer: Self-pay | Admitting: Family Medicine

## 2020-07-10 MED FILL — VIT D2 1.25 MG (50,000 UNIT: 1.25 MG | 28 days supply | Qty: 4 | Fill #7

## 2020-07-12 ENCOUNTER — Telehealth: Payer: Self-pay | Admitting: Family Medicine

## 2020-07-12 ENCOUNTER — Other Ambulatory Visit: Payer: Self-pay | Admitting: Family Medicine

## 2020-07-12 DIAGNOSIS — I10 Essential (primary) hypertension: Secondary | ICD-10-CM

## 2020-07-12 MED ORDER — AMLODIPINE BESYLATE 10 MG PO TABS: 10.0000 mg | ORAL_TABLET | Freq: Every day | ORAL | 11 refills | Status: DC

## 2020-07-13 MED FILL — AMLODIPINE BESYLATE 10 MG T: 10 | 30 days supply | Qty: 30 | Fill #0

## 2020-07-13 NOTE — Telephone Encounter (Signed)
Sent to Provider and CMA 

## 2020-07-16 MED FILL — AMLODIPINE BESYLATE 10 MG T: 10 | 90 days supply | Qty: 90 | Fill #0

## 2020-08-07 ENCOUNTER — Other Ambulatory Visit: Payer: Self-pay | Admitting: Family Medicine

## 2020-08-09 ENCOUNTER — Encounter: Payer: Self-pay | Admitting: Family Medicine

## 2020-08-09 ENCOUNTER — Other Ambulatory Visit: Payer: Self-pay | Admitting: Family Medicine

## 2020-08-09 ENCOUNTER — Telehealth: Payer: Self-pay | Admitting: Family Medicine

## 2020-08-09 DIAGNOSIS — R232 Flushing: Secondary | ICD-10-CM

## 2020-08-09 MED ORDER — VENLAFAXINE HCL ER 75 MG PO CP24: ORAL_CAPSULE | ORAL | 3 refills | Status: DC

## 2020-08-09 MED FILL — VIT D2 1.25 MG (50,000 UNIT: 1.25 MG | 21 days supply | Qty: 3 | Fill #0

## 2020-08-13 NOTE — Telephone Encounter (Signed)
Sent to provider 

## 2020-08-21 ENCOUNTER — Ambulatory Visit: Payer: No Typology Code available for payment source | Admitting: Family Medicine

## 2020-08-23 MED FILL — VENLAFAXINE HCL ER 75 MG CA: 75 | 30 days supply | Qty: 60 | Fill #0

## 2020-10-11 MED FILL — VENLAFAXINE HCL ER 75 MG CA: 75 | 30 days supply | Qty: 60 | Fill #1

## 2020-10-24 MED FILL — VENLAFAXINE HCL ER 75 MG CA: 75 | 30 days supply | Qty: 60 | Fill #1

## 2020-10-24 MED FILL — AMLODIPINE BESYLATE 10 MG T: 10 | 90 days supply | Qty: 90 | Fill #1

## 2020-12-28 MED FILL — VENLAFAXINE HCL ER 75 MG CA: 75 | 30 days supply | Qty: 60 | Fill #2

## 2021-01-21 MED FILL — AMLODIPINE BESYLATE 10 MG T: 10 | 90 days supply | Qty: 90 | Fill #2

## 2021-02-18 MED FILL — Venlafaxine HCl Cap ER 24HR 75 MG (Base Equivalent): ORAL | 30 days supply | Qty: 60 | Fill #0 | Status: AC

## 2021-02-19 ENCOUNTER — Other Ambulatory Visit (HOSPITAL_COMMUNITY): Payer: Self-pay

## 2021-02-21 ENCOUNTER — Other Ambulatory Visit (HOSPITAL_COMMUNITY): Payer: Self-pay

## 2023-02-02 ENCOUNTER — Other Ambulatory Visit: Payer: Self-pay

## 2024-05-20 ENCOUNTER — Other Ambulatory Visit (HOSPITAL_COMMUNITY): Payer: Self-pay
